# Patient Record
Sex: Female | Born: 1970 | Race: White | Hispanic: No | Marital: Married | Smoking: Former smoker
Health system: Southern US, Community
[De-identification: ages and names within clinical notes are randomized; demographics above are authoritative.]

## PROBLEM LIST (undated history)

## (undated) DIAGNOSIS — K219 Gastro-esophageal reflux disease without esophagitis: Secondary | ICD-10-CM

## (undated) DIAGNOSIS — E785 Hyperlipidemia, unspecified: Secondary | ICD-10-CM

## (undated) DIAGNOSIS — F32A Depression, unspecified: Secondary | ICD-10-CM

## (undated) DIAGNOSIS — F329 Major depressive disorder, single episode, unspecified: Secondary | ICD-10-CM

## (undated) DIAGNOSIS — M1990 Unspecified osteoarthritis, unspecified site: Secondary | ICD-10-CM

## (undated) DIAGNOSIS — F419 Anxiety disorder, unspecified: Secondary | ICD-10-CM

## (undated) DIAGNOSIS — I10 Essential (primary) hypertension: Secondary | ICD-10-CM

## (undated) HISTORY — PX: ENDOMETRIAL ABLATION W/ NOVASURE: SUR434

## (undated) HISTORY — PX: SHOULDER ARTHROSCOPY: SHX128

## (undated) HISTORY — PX: TUBAL LIGATION: SHX77

## (undated) HISTORY — PX: CHOLECYSTECTOMY: SHX55

## (undated) HISTORY — DX: Gastro-esophageal reflux disease without esophagitis: K21.9

## (undated) HISTORY — PX: SHOULDER OPEN ROTATOR CUFF REPAIR: SHX2407

---

## 2017-10-18 ENCOUNTER — Encounter: Payer: Self-pay | Admitting: Gastroenterology

## 2018-01-18 ENCOUNTER — Ambulatory Visit: Payer: Self-pay | Admitting: Nurse Practitioner

## 2018-01-18 NOTE — Progress Notes (Deleted)
Primary Care Physician:  Avon GullyFanta, Tesfaye, MD Primary Gastroenterologist:  Dr.   Bonnetta BarryNo chief complaint on file.   HPI:   Teresa Evans is a 47 y.o. female who presents on referral from primary care for rectal bleeding.  Reviewed information provided with the referral including ***.  No history of colonoscopy or endoscopy in our system.  Today she states   No past medical history on file.  *** The histories are not reviewed yet. Please review them in the "History" navigator section and refresh this SmartLink.  No current outpatient medications on file.   No current facility-administered medications for this visit.     Allergies as of 01/18/2018  . (Not on File)    No family history on file.  Social History   Socioeconomic History  . Marital status: Not on file    Spouse name: Not on file  . Number of children: Not on file  . Years of education: Not on file  . Highest education level: Not on file  Occupational History  . Not on file  Social Needs  . Financial resource strain: Not on file  . Food insecurity:    Worry: Not on file    Inability: Not on file  . Transportation needs:    Medical: Not on file    Non-medical: Not on file  Tobacco Use  . Smoking status: Not on file  Substance and Sexual Activity  . Alcohol use: Not on file  . Drug use: Not on file  . Sexual activity: Not on file  Lifestyle  . Physical activity:    Days per week: Not on file    Minutes per session: Not on file  . Stress: Not on file  Relationships  . Social connections:    Talks on phone: Not on file    Gets together: Not on file    Attends religious service: Not on file    Active member of club or organization: Not on file    Attends meetings of clubs or organizations: Not on file    Relationship status: Not on file  . Intimate partner violence:    Fear of current or ex partner: Not on file    Emotionally abused: Not on file    Physically abused: Not on file    Forced sexual  activity: Not on file  Other Topics Concern  . Not on file  Social History Narrative  . Not on file    Review of Systems: General: Negative for anorexia, weight loss, fever, chills, fatigue, weakness. Eyes: Negative for vision changes.  ENT: Negative for hoarseness, difficulty swallowing , nasal congestion. CV: Negative for chest pain, angina, palpitations, dyspnea on exertion, peripheral edema.  Respiratory: Negative for dyspnea at rest, dyspnea on exertion, cough, sputum, wheezing.  GI: See history of present illness. GU:  Negative for dysuria, hematuria, urinary incontinence, urinary frequency, nocturnal urination.  MS: Negative for joint pain, low back pain.  Derm: Negative for rash or itching.  Neuro: Negative for weakness, abnormal sensation, seizure, frequent headaches, memory loss, confusion.  Psych: Negative for anxiety, depression, suicidal ideation, hallucinations.  Endo: Negative for unusual weight change.  Heme: Negative for bruising or bleeding. Allergy: Negative for rash or hives.    Physical Exam: There were no vitals taken for this visit. General:   Alert and oriented. Pleasant and cooperative. Well-nourished and well-developed.  Head:  Normocephalic and atraumatic. Eyes:  Without icterus, sclera clear and conjunctiva pink.  Ears:  Normal auditory  acuity. Mouth:  No deformity or lesions, oral mucosa pink.  Throat/Neck:  Supple, without mass or thyromegaly. Cardiovascular:  S1, S2 present without murmurs appreciated. Normal pulses noted. Extremities without clubbing or edema. Respiratory:  Clear to auscultation bilaterally. No wheezes, rales, or rhonchi. No distress.  Gastrointestinal:  +BS, soft, non-tender and non-distended. No HSM noted. No guarding or rebound. No masses appreciated.  Rectal:  Deferred  Musculoskalatal:  Symmetrical without gross deformities. Normal posture. Skin:  Intact without significant lesions or rashes. Neurologic:  Alert and oriented  x4;  grossly normal neurologically. Psych:  Alert and cooperative. Normal mood and affect. Heme/Lymph/Immune: No significant cervical adenopathy. No excessive bruising noted.    01/18/2018 8:11 AM   Disclaimer: This note was dictated with voice recognition software. Similar sounding words can inadvertently be transcribed and may not be corrected upon review.

## 2018-03-29 ENCOUNTER — Other Ambulatory Visit: Payer: Self-pay | Admitting: Orthopedic Surgery

## 2018-03-30 ENCOUNTER — Encounter (HOSPITAL_BASED_OUTPATIENT_CLINIC_OR_DEPARTMENT_OTHER): Payer: Self-pay | Admitting: *Deleted

## 2018-03-30 ENCOUNTER — Other Ambulatory Visit: Payer: Self-pay

## 2018-03-30 NOTE — H&P (Signed)
Teresa Evans is an 48 y.o. female.   CC / Reason for Visit: Left wrist injury HPI: This patient returns reevaluation, indicating that she is having some altered sensibility in the hand, affecting at times all the digits.  She continues medications for pain management, and her cast has slid somewhat distally, to some degree blocking MP motion.  HPI 03-21-18: This patient returns reevaluation, in the sugar tong splint which has migrated somewhat distally.  She continues to use oxycodone every 6 hours, in addition to the other medicines.  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  She reports that she was coming down off of a ladder at work, missing the last step and falling onto an outstretched left hand.  She was evaluated at urgent care, where x-rays were obtained and she was placed into a prefabricated short arm splint and provided a prescription for Relafen.  She reports that her pain has been difficult to manage.  She was not at work today.  Past Medical History:  Diagnosis Date  . Arthritis   . Depression   . Hyperlipemia   . Hypertension     Past Surgical History:  Procedure Laterality Date  . CESAREAN SECTION     x3  . ENDOMETRIAL ABLATION W/ NOVASURE    . SHOULDER ARTHROSCOPY    . SHOULDER OPEN ROTATOR CUFF REPAIR    . TUBAL LIGATION      History reviewed. No pertinent family history. Social History:  reports that she has never smoked. She has never used smokeless tobacco. She reports that she does not drink alcohol or use drugs.  Allergies: No Known Allergies  No medications prior to admission.    No results found for this or any previous visit (from the past 48 hour(s)). No results found.  Review of Systems  All other systems reviewed and are negative.   Height 5\' 1"  (1.549 m), weight 90.7 kg. Physical Exam  Constitutional:  WD, WN, NAD HEENT:  NCAT, EOMI Neuro/Psych:  Alert &  oriented to person, place, and time; appropriate mood & affect Lymphatic: No generalized UE edema or lymphadenopathy Extremities / MSK:  Both UE are normal with respect to appearance, ranges of motion, joint stability, muscle strength/tone, sensation, & perfusion except as otherwise noted:  [facility] has slid distally, blocking MP motion.  Mildly affecting all the digits, with a mild Tinel sign over the ulnar nerve at the elbow.  Labs / Xrays:  4 views of the left wrist ordered and obtained today in the splint reveal an intra-articular fracture of the distal radius without significant intra-articular incongruity.  Radial inclination is just 5, and dorsal tilt has increased 18.  Assessment: Slightly impacted left distal radius fracture with intra-articular component, without significant intra-articular incongruity, with progressive change in alignment parameters despite immobilization, and new onset neurological symptoms, including median neuropathic symptoms  Plan:  I discussed these findings with her and her husband.  I recommended open treatment of the distal radius fracture combined with an open carpal tunnel release given the new onset of neurological symptoms in the change in the fracture parameters.  A model was used to demonstrate the issues that arise with parameters as they exist.  We will proceed either on Friday or Monday, pending Worker's Compensation authorization.  The cast was bivalved and overwrapped today to expedite care on the day of surgery.  Oxycodone was refilled electronically.  We discussed  being out of work following surgery for roughly a week before returning back to light duty work as she is presently performing.  The details of the operative procedure were discussed with the patient.  Questions were invited and answered.  In addition to the goal of the procedure, the risks of the procedure to include but not limited to bleeding; infection; damage to the nerves or blood vessels  that could result in bleeding, numbness, weakness, chronic pain, and the need for additional procedures; stiffness; the need for revision surgery; and anesthetic risks were reviewed.  No specific outcome was guaranteed or implied.  Informed consent was obtained.  Jodi Marble, MD 03/30/2018, 5:08 PM

## 2018-04-03 ENCOUNTER — Ambulatory Visit (HOSPITAL_BASED_OUTPATIENT_CLINIC_OR_DEPARTMENT_OTHER): Payer: PRIVATE HEALTH INSURANCE | Admitting: Anesthesiology

## 2018-04-03 ENCOUNTER — Encounter (HOSPITAL_BASED_OUTPATIENT_CLINIC_OR_DEPARTMENT_OTHER)
Admission: RE | Disposition: A | Discharge status: Home / Self Care | Payer: Self-pay | Source: Home / Self Care | Attending: Orthopedic Surgery

## 2018-04-03 ENCOUNTER — Ambulatory Visit (HOSPITAL_BASED_OUTPATIENT_CLINIC_OR_DEPARTMENT_OTHER)
Admission: RE | Admit: 2018-04-03 | Discharge: 2018-04-03 | Disposition: A | Discharge status: Home / Self Care | Payer: PRIVATE HEALTH INSURANCE | Attending: Orthopedic Surgery | Admitting: Orthopedic Surgery

## 2018-04-03 ENCOUNTER — Ambulatory Visit (HOSPITAL_COMMUNITY): Payer: PRIVATE HEALTH INSURANCE

## 2018-04-03 ENCOUNTER — Other Ambulatory Visit: Payer: Self-pay

## 2018-04-03 ENCOUNTER — Encounter (HOSPITAL_BASED_OUTPATIENT_CLINIC_OR_DEPARTMENT_OTHER): Payer: Self-pay | Admitting: *Deleted

## 2018-04-03 DIAGNOSIS — S52572A Other intraarticular fracture of lower end of left radius, initial encounter for closed fracture: Secondary | ICD-10-CM | POA: Insufficient documentation

## 2018-04-03 DIAGNOSIS — F329 Major depressive disorder, single episode, unspecified: Secondary | ICD-10-CM | POA: Diagnosis not present

## 2018-04-03 DIAGNOSIS — Z79899 Other long term (current) drug therapy: Secondary | ICD-10-CM | POA: Insufficient documentation

## 2018-04-03 DIAGNOSIS — G5602 Carpal tunnel syndrome, left upper limb: Secondary | ICD-10-CM | POA: Insufficient documentation

## 2018-04-03 DIAGNOSIS — W11XXXA Fall on and from ladder, initial encounter: Secondary | ICD-10-CM | POA: Insufficient documentation

## 2018-04-03 DIAGNOSIS — M1990 Unspecified osteoarthritis, unspecified site: Secondary | ICD-10-CM | POA: Diagnosis not present

## 2018-04-03 DIAGNOSIS — Z419 Encounter for procedure for purposes other than remedying health state, unspecified: Secondary | ICD-10-CM

## 2018-04-03 DIAGNOSIS — I10 Essential (primary) hypertension: Secondary | ICD-10-CM | POA: Diagnosis not present

## 2018-04-03 DIAGNOSIS — E785 Hyperlipidemia, unspecified: Secondary | ICD-10-CM | POA: Insufficient documentation

## 2018-04-03 DIAGNOSIS — Y990 Civilian activity done for income or pay: Secondary | ICD-10-CM | POA: Diagnosis not present

## 2018-04-03 HISTORY — DX: Depression, unspecified: F32.A

## 2018-04-03 HISTORY — DX: Hyperlipidemia, unspecified: E78.5

## 2018-04-03 HISTORY — PX: CARPAL TUNNEL RELEASE: SHX101

## 2018-04-03 HISTORY — PX: OPEN REDUCTION INTERNAL FIXATION (ORIF) DISTAL RADIAL FRACTURE: SHX5989

## 2018-04-03 HISTORY — DX: Major depressive disorder, single episode, unspecified: F32.9

## 2018-04-03 HISTORY — DX: Essential (primary) hypertension: I10

## 2018-04-03 HISTORY — DX: Unspecified osteoarthritis, unspecified site: M19.90

## 2018-04-03 SURGERY — OPEN REDUCTION INTERNAL FIXATION (ORIF) DISTAL RADIUS FRACTURE
Anesthesia: General | Site: Wrist | Laterality: Left

## 2018-04-03 MED ORDER — LACTATED RINGERS IV SOLN: INTRAVENOUS | Status: DC

## 2018-04-03 MED ORDER — ONDANSETRON HCL 4 MG/2ML IJ SOLN: 4.0000 mg | Freq: Once | INTRAMUSCULAR | Status: DC | PRN

## 2018-04-03 MED ORDER — FENTANYL CITRATE (PF) 100 MCG/2ML IJ SOLN: 25.0000 ug | INTRAMUSCULAR | Status: DC | PRN

## 2018-04-03 MED ORDER — LACTATED RINGERS IV SOLN
INTRAVENOUS | Status: DC
  Administered 2018-04-03 (×2): via INTRAVENOUS

## 2018-04-03 MED ORDER — FENTANYL CITRATE (PF) 100 MCG/2ML IJ SOLN
50.0000 ug | INTRAMUSCULAR | Status: DC | PRN
  Administered 2018-04-03: 50 ug via INTRAVENOUS

## 2018-04-03 MED ORDER — 0.9 % SODIUM CHLORIDE (POUR BTL) OPTIME
TOPICAL | Status: DC | PRN
  Administered 2018-04-03: 300 mL

## 2018-04-03 MED ORDER — ACETAMINOPHEN 325 MG PO TABS: 650.0000 mg | ORAL_TABLET | Freq: Four times a day (QID) | ORAL | Status: DC

## 2018-04-03 MED ORDER — CEFAZOLIN SODIUM-DEXTROSE 2-4 GM/100ML-% IV SOLN
2.0000 g | INTRAVENOUS | Status: AC
  Administered 2018-04-03: 2 g via INTRAVENOUS

## 2018-04-03 MED ORDER — MIDAZOLAM HCL 2 MG/2ML IJ SOLN
1.0000 mg | INTRAMUSCULAR | Status: DC | PRN
  Administered 2018-04-03: 2 mg via INTRAVENOUS

## 2018-04-03 MED ORDER — BUPIVACAINE-EPINEPHRINE (PF) 0.25% -1:200000 IJ SOLN
INTRAMUSCULAR | Status: AC
  Filled 2018-04-03: qty 30

## 2018-04-03 MED ORDER — CLONIDINE HCL (ANALGESIA) 100 MCG/ML EP SOLN
EPIDURAL | Status: DC | PRN
  Administered 2018-04-03: 50 ug

## 2018-04-03 MED ORDER — OXYCODONE HCL 5 MG PO TABS: 5.0000 mg | ORAL_TABLET | Freq: Four times a day (QID) | ORAL | 0 refills | Status: AC | PRN

## 2018-04-03 MED ORDER — FENTANYL CITRATE (PF) 100 MCG/2ML IJ SOLN
INTRAMUSCULAR | Status: AC
  Filled 2018-04-03: qty 2

## 2018-04-03 MED ORDER — SCOPOLAMINE 1 MG/3DAYS TD PT72: 1.0000 | MEDICATED_PATCH | Freq: Once | TRANSDERMAL | Status: DC | PRN

## 2018-04-03 MED ORDER — PROPOFOL 10 MG/ML IV BOLUS
INTRAVENOUS | Status: DC | PRN
  Administered 2018-04-03: 150 mg via INTRAVENOUS
  Administered 2018-04-03: 50 mg via INTRAVENOUS

## 2018-04-03 MED ORDER — ONDANSETRON HCL 4 MG/2ML IJ SOLN
INTRAMUSCULAR | Status: DC | PRN
  Administered 2018-04-03: 4 mg via INTRAVENOUS

## 2018-04-03 MED ORDER — CEFAZOLIN SODIUM-DEXTROSE 2-4 GM/100ML-% IV SOLN
INTRAVENOUS | Status: AC
  Filled 2018-04-03: qty 100

## 2018-04-03 MED ORDER — BUPIVACAINE-EPINEPHRINE (PF) 0.5% -1:200000 IJ SOLN
INTRAMUSCULAR | Status: DC | PRN
  Administered 2018-04-03: 30 mL via PERINEURAL

## 2018-04-03 MED ORDER — DEXAMETHASONE SODIUM PHOSPHATE 10 MG/ML IJ SOLN
INTRAMUSCULAR | Status: DC | PRN
  Administered 2018-04-03: 10 mg via INTRAVENOUS

## 2018-04-03 MED ORDER — MIDAZOLAM HCL 2 MG/2ML IJ SOLN
INTRAMUSCULAR | Status: AC
  Filled 2018-04-03: qty 2

## 2018-04-03 SURGICAL SUPPLY — 73 items
APPLICATOR COTTON TIP 6 STRL (MISCELLANEOUS) ×1 IMPLANT
APPLICATOR COTTON TIP 6IN STRL (MISCELLANEOUS) ×2
BIT DRILL SOLID 2.0X40MM (BIT) IMPLANT
BIT DRILL SOLID 2.5X40MM (BIT) IMPLANT
BLADE HOOK ENDO STRL (BLADE) ×2 IMPLANT
BLADE MINI RND TIP GREEN BEAV (BLADE) IMPLANT
BLADE SURG 15 STRL LF DISP TIS (BLADE) ×1 IMPLANT
BLADE SURG 15 STRL SS (BLADE) ×2
BLADE TRIANGLE EPF/EGR ENDO (BLADE) ×2 IMPLANT
BNDG COHESIVE 2X5 TAN STRL LF (GAUZE/BANDAGES/DRESSINGS) IMPLANT
BNDG COHESIVE 4X5 TAN STRL (GAUZE/BANDAGES/DRESSINGS) ×2 IMPLANT
BNDG ESMARK 4X9 LF (GAUZE/BANDAGES/DRESSINGS) ×2 IMPLANT
BNDG GAUZE ELAST 4 BULKY (GAUZE/BANDAGES/DRESSINGS) ×2 IMPLANT
BRUSH SCRUB EZ PLAIN DRY (MISCELLANEOUS) ×1 IMPLANT
CANISTER SUCT 1200ML W/VALVE (MISCELLANEOUS) ×2 IMPLANT
CHLORAPREP W/TINT 26ML (MISCELLANEOUS) ×2 IMPLANT
CORD BIPOLAR FORCEPS 12FT (ELECTRODE) ×2 IMPLANT
COVER BACK TABLE 60X90IN (DRAPES) ×2 IMPLANT
COVER MAYO STAND STRL (DRAPES) ×2 IMPLANT
COVER WAND RF STERILE (DRAPES) IMPLANT
CUFF TOURNIQUET SINGLE 18IN (TOURNIQUET CUFF) ×1 IMPLANT
CUFF TOURNIQUET SINGLE 24IN (TOURNIQUET CUFF) IMPLANT
DRAIN TLS ROUND 10FR (DRAIN) IMPLANT
DRAPE C-ARM 42X72 X-RAY (DRAPES) ×2 IMPLANT
DRAPE EXTREMITY T 121X128X90 (DISPOSABLE) ×2 IMPLANT
DRAPE SURG 17X23 STRL (DRAPES) ×2 IMPLANT
DRILL SOLID 2.0X40MM (BIT)
DRILL SOLID 2.5X40MM (BIT)
DRSG ADAPTIC 3X8 NADH LF (GAUZE/BANDAGES/DRESSINGS) ×2 IMPLANT
DRSG EMULSION OIL 3X3 NADH (GAUZE/BANDAGES/DRESSINGS) ×2 IMPLANT
Driver ×2 IMPLANT
ELECT REM PT RETURN 9FT ADLT (ELECTROSURGICAL) ×2
ELECTRODE REM PT RTRN 9FT ADLT (ELECTROSURGICAL) ×1 IMPLANT
GAUZE SPONGE 4X4 12PLY STRL LF (GAUZE/BANDAGES/DRESSINGS) ×2 IMPLANT
GLOVE BIO SURGEON STRL SZ7.5 (GLOVE) ×2 IMPLANT
GLOVE BIOGEL PI IND STRL 7.0 (GLOVE) ×1 IMPLANT
GLOVE BIOGEL PI IND STRL 8 (GLOVE) ×1 IMPLANT
GLOVE BIOGEL PI INDICATOR 7.0 (GLOVE) ×1
GLOVE BIOGEL PI INDICATOR 8 (GLOVE) ×1
GLOVE ECLIPSE 6.5 STRL STRAW (GLOVE) ×2 IMPLANT
GOWN STRL REUS W/ TWL LRG LVL3 (GOWN DISPOSABLE) ×2 IMPLANT
GOWN STRL REUS W/TWL LRG LVL3 (GOWN DISPOSABLE) ×2
GOWN STRL REUS W/TWL XL LVL3 (GOWN DISPOSABLE) ×2 IMPLANT
GUIDE AIMING 1.5MM (WIRE) ×1 IMPLANT
NDL HYPO 25X1 1.5 SAFETY (NEEDLE) IMPLANT
NEEDLE HYPO 25X1 1.5 SAFETY (NEEDLE) IMPLANT
NS IRRIG 1000ML POUR BTL (IV SOLUTION) ×2 IMPLANT
PACK BASIN DAY SURGERY FS (CUSTOM PROCEDURE TRAY) ×2 IMPLANT
PADDING CAST ABS 4INX4YD NS (CAST SUPPLIES) ×1
PADDING CAST ABS COTTON 4X4 ST (CAST SUPPLIES) IMPLANT
PENCIL BUTTON HOLSTER BLD 10FT (ELECTRODE) ×2 IMPLANT
RUBBERBAND STERILE (MISCELLANEOUS) IMPLANT
SCREW GEMINUS PALS 2.5X12 (Screw) ×1 IMPLANT
SKELETAL DYNAMICS DVR SET (Set) ×1 IMPLANT
SLEEVE SCD COMPRESS KNEE MED (MISCELLANEOUS) ×2 IMPLANT
SLING ARM FOAM STRAP LRG (SOFTGOODS) ×1 IMPLANT
SPLINT PLASTER CAST XFAST 3X15 (CAST SUPPLIES) IMPLANT
SPLINT PLASTER XTRA FASTSET 3X (CAST SUPPLIES) ×7
STOCKINETTE 6  STRL (DRAPES) ×1
STOCKINETTE 6 STRL (DRAPES) ×1 IMPLANT
SUCTION FRAZIER HANDLE 10FR (MISCELLANEOUS) ×1
SUCTION TUBE FRAZIER 10FR DISP (MISCELLANEOUS) ×1 IMPLANT
SUT VIC AB 2-0 PS2 27 (SUTURE) ×2 IMPLANT
SUT VICRYL 4-0 PS2 18IN ABS (SUTURE) IMPLANT
SUT VICRYL RAPIDE 4-0 (SUTURE) ×1 IMPLANT
SUT VICRYL RAPIDE 4/0 PS 2 (SUTURE) ×2 IMPLANT
SYR 10ML LL (SYRINGE) IMPLANT
SYR BULB 3OZ (MISCELLANEOUS) ×2 IMPLANT
TOWEL GREEN STERILE FF (TOWEL DISPOSABLE) ×4 IMPLANT
TUBE CONNECTING 20X1/4 (TUBING) ×2 IMPLANT
UNDERPAD 30X30 (UNDERPADS AND DIAPERS) ×2 IMPLANT
WIRE FIX 1.5 STANDARD TIP (WIRE)
WIRE FIX 1.5 STD TIP (WIRE) IMPLANT

## 2018-04-03 NOTE — Discharge Instructions (Addendum)
Discharge Instructions   You have a dressing with a plaster splint incorporated in it. Move your fingers as much as possible, making a full fist and fully opening the fist. Elevate your hand to reduce pain & swelling of the digits.  Ice over the operative site or in the arm pit may be helpful to reduce pain & swelling.  DO NOT USE HEAT. Pain medicine has been prescribed for you.  Take Tylenol 650 mg and Ibuprofen 600 mg every 6 hours together. Take Oxycodone 5 mg as a rescue medicine. Take [phone] mg of vitamin C daily to help with healing. Leave the dressing in place until you return to our office.  You may shower, but keep the bandage clean & dry.  You may drive a car when you are off of prescription pain medications and can safely control your vehicle with both hands. Our office will call you to arrange follow-up   Please call [phone] during normal business hours or [phone] after hours for any problems. Including the following:  - excessive redness of the incisions - drainage for more than 4 days - fever of more than 101.5 F  *Please note that pain medications will not be refilled after hours or on weekends.  WORK STATUS:   No work from today thru March 1st. May return to work on Monday, March 2nd, with no work with left hand for 2 weeks.    Post Anesthesia Home Care Instructions  Activity: Get plenty of rest for the remainder of the day. A responsible individual must stay with you for 24 hours following the procedure.  For the next 24 hours, DO NOT: -Drive a car -Advertising copywriter -Drink alcoholic beverages -Take any medication unless instructed by your physician -Make any legal decisions or sign important papers.  Meals: Start with liquid foods such as gelatin or soup. Progress to regular foods as tolerated. Avoid greasy, spicy, heavy foods. If nausea and/or vomiting occur, drink only clear liquids until the nausea and/or vomiting subsides. Call your physician  if vomiting continues.  Special Instructions/Symptoms: Your throat may feel dry or sore from the anesthesia or the breathing tube placed in your throat during surgery. If this causes discomfort, gargle with warm salt water. The discomfort should disappear within 24 hours.  If you had a scopolamine patch placed behind your ear for the management of post- operative nausea and/or vomiting:  1. The medication in the patch is effective for 72 hours, after which it should be removed.  Wrap patch in a tissue and discard in the trash. Wash hands thoroughly with soap and water. 2. You may remove the patch earlier than 72 hours if you experience unpleasant side effects which may include dry mouth, dizziness or visual disturbances. 3. Avoid touching the patch. Wash your hands with soap and water after contact with the patch.     Regional Anesthesia Blocks  1. Numbness or the inability to move the "blocked" extremity may last from 3-48 hours after placement. The length of time depends on the medication injected and your individual response to the medication. If the numbness is not going away after 48 hours, call your surgeon.  2. The extremity that is blocked will need to be protected until the numbness is gone and the  Strength has returned. Because you cannot feel it, you will need to take extra care to avoid injury. Because it may be weak, you may have difficulty moving it or using it. You may not know what position  it is in without looking at it while the block is in effect.  3. For blocks in the legs and feet, returning to weight bearing and walking needs to be done carefully. You will need to wait until the numbness is entirely gone and the strength has returned. You should be able to move your leg and foot normally before you try and bear weight or walk. You will need someone to be with you when you first try to ensure you do not fall and possibly risk injury.  4. Bruising and tenderness at the needle  site are common side effects and will resolve in a few days.  5. Persistent numbness or new problems with movement should be communicated to the surgeon or the [facility] [phone] [facility] [phone]).

## 2018-04-03 NOTE — Transfer of Care (Signed)
Immediate Anesthesia Transfer of Care Note  Patient: Teresa Evans  Procedure(s) Performed: OPEN TREATMENT OF LEFT DISTAL RADIAL FRACTURE (Left Wrist) OPEN LEFT CARPAL TUNNEL RELEASE (Left Wrist)  Patient Location: PACU  Anesthesia Type:GA combined with regional for post-op pain  Level of Consciousness: sedated  Airway & Oxygen Therapy: Patient Spontanous Breathing and Patient connected to face mask oxygen  Post-op Assessment: Report given to RN and Post -op Vital signs reviewed and stable  Post vital signs: Reviewed and stable  Last Vitals:  Vitals Value Taken Time  BP 100/62 04/03/2018 11:56 AM  Temp    Pulse 69 04/03/2018 11:58 AM  Resp 18 04/03/2018 11:58 AM  SpO2 94 % 04/03/2018 11:58 AM  Vitals shown include unvalidated device data.  Last Pain:  Vitals:   04/03/18 0816  TempSrc: Oral  PainSc: 3          Complications: No apparent anesthesia complications

## 2018-04-03 NOTE — Anesthesia Procedure Notes (Signed)
Anesthesia Regional Block: Supraclavicular block   Pre-Anesthetic Checklist: ,, timeout performed, Correct Patient, Correct Site, Correct Laterality, Correct Procedure, Correct Position, site marked, Risks and benefits discussed,  Surgical consent,  Pre-op evaluation,  At surgeon's request and post-op pain management  Laterality: Left  Prep: chloraprep       Needles:  Injection technique: Single-shot  Needle Type: Echogenic Stimulator Needle     Needle Length: 9cm  Needle Gauge: 21     Additional Needles:   Procedures:, nerve stimulator,,, ultrasound used (permanent image in chart),,,,   Nerve Stimulator or Paresthesia:  Response: deltoid, biceps, wrist flexion, 0.5 mA,   Additional Responses:   Narrative:  Start time: 04/03/2018 9:33 AM End time: 04/03/2018 9:40 AM Injection made incrementally with aspirations every 5 mL.  Performed by: Personally  Anesthesiologist: Marcene Duos, MD

## 2018-04-03 NOTE — Anesthesia Preprocedure Evaluation (Signed)
Anesthesia Evaluation  Patient identified by MRN, date of birth, ID band Patient awake    Reviewed: Allergy & Precautions, NPO status , Patient's Chart, lab work & pertinent test results  Airway Mallampati: II  TM Distance: >3 FB Neck ROM: Full    Dental   Pulmonary neg pulmonary ROS,    breath sounds clear to auscultation       Cardiovascular hypertension, Pt. on medications negative cardio ROS   Rhythm:Regular Rate:Normal     Neuro/Psych negative neurological ROS  negative psych ROS   GI/Hepatic negative GI ROS, Neg liver ROS,   Endo/Other  negative endocrine ROS  Renal/GU negative Renal ROS  negative genitourinary   Musculoskeletal  (+) Arthritis ,   Abdominal   Peds negative pediatric ROS (+)  Hematology negative hematology ROS (+)   Anesthesia Other Findings   Reproductive/Obstetrics negative OB ROS                            No results found for: WBC, HGB, HCT, MCV, PLT  Anesthesia Physical Anesthesia Plan  ASA: II  Anesthesia Plan: General   Post-op Pain Management:  Regional for Post-op pain   Induction: Intravenous  PONV Risk Score and Plan: 3 and Dexamethasone, Ondansetron and Treatment may vary due to age or medical condition  Airway Management Planned: LMA  Additional Equipment:   Intra-op Plan:   Post-operative Plan: Extubation in OR  Informed Consent: I have reviewed the patients History and Physical, chart, labs and discussed the procedure including the risks, benefits and alternatives for the proposed anesthesia with the patient or authorized representative who has indicated his/her understanding and acceptance.     Dental advisory given  Plan Discussed with: CRNA  Anesthesia Plan Comments:         Anesthesia Quick Evaluation

## 2018-04-03 NOTE — Progress Notes (Signed)
Assisted Dr. Rob Fitzgerald with left, ultrasound guided, supraclavicular block. Side rails up, monitors on throughout procedure. See vital signs in flow sheet. Tolerated Procedure well. 

## 2018-04-03 NOTE — Interval H&P Note (Signed)
History and Physical Interval Note:  04/03/2018 10:19 AM  Teresa Evans  has presented today for surgery, with the diagnosis of Displaced left distal radius fracture and carpal tunnel syndrome  The various methods of treatment have been discussed with the patient and family. After consideration of risks, benefits and other options for treatment, the patient has consented to  Procedure(s): OPEN TREATMENT OF LEFT DISTAL RADIAL FRACTURE (Left) OPEN LEFT CARPAL TUNNEL RELEASE (Left) as a surgical intervention .  The patient's history has been reviewed, patient examined, no change in status, stable for surgery.  I have reviewed the patient's chart and labs.  Questions were answered to the patient's satisfaction.     Jodi Marble

## 2018-04-03 NOTE — Op Note (Signed)
04/03/2018  12:04 PM  PATIENT:  Teresa Evans  48 y.o. female  PRE-OPERATIVE DIAGNOSIS:   1. Displaced left distal radius fracture      2. Left carpal tunnel syndrome  POST-OPERATIVE DIAGNOSIS:  Same  PROCEDURE:   1.  Open treatment of left distal radius fracture, intra-articular, with 2 fragments, 17915    2.  Open left carpal tunnel release     SURGEON: Cliffton Asters. Janee Morn, MD  PHYSICIAN ASSISTANT: Danielle Rankin, OPA-C  ANESTHESIA:  regional and general  SPECIMENS:  None  DRAINS: None  EBL:  less than 50 mL  PREOPERATIVE INDICATIONS:  Teresa Evans is a  48 y.o. female with displaced intra-articular (DRUJ) left distal radius fracture and carpal tunnel syndrome, with significant progressive displacement of the distal radius fracture  The risks benefits and alternatives were discussed with the patient preoperatively including but not limited to the risks of infection, bleeding, nerve injury, cardiopulmonary complications, the need for revision surgery, among others, and the patient verbalized understanding and consented to proceed.  OPERATIVE IMPLANTS: Skeletal dynamics Geminus plate/screws/pegs  OPERATIVE PROCEDURE: After receiving prophylactic antibiotics and a regional block, the patient was escorted to the operative theatre and placed in a supine position.  General anesthesia was administered.  A surgical "time-out" was performed during which the planned procedure, proposed operative site, and the correct patient identity were compared to the operative consent and agreement confirmed by the circulating nurse according to current facility policy. Following application of a tourniquet to the operative extremity, the exposed skin was pre-scrubbed with Hibiclens scrub brush and then was prepped with Chloraprep and draped in the usual sterile fashion. The limb was exsanguinated with an Esmarch bandage and the tourniquet inflated to approximately higher than systolic BP.   The  carpal tunnel release was performed first, by making a linear longitudinal incision in the base of the palm in line with the radial border of the ring ray.  The skin was incised sharply with a scalpel, subcutaneous tissues with spreading dissection down to the palmar fascia.  The distal edge of the transverse carpal ligament was identified and then the transverse carpal ligament and palmar fascia was split with a knife, protecting the contents of the tunnel with an Adson forcep.  Once the transverse carpal ligament was completely incised, opening the tunnel, the distal forearm fascia was split for an inch or 2 proximally.    Next, a sinusoidal-shaped incision was marked and made over the FCR axis and the distal forearm.  Ultimately, this was made continuous with the distal incision.  The skin was incised sharply with scalpel, subcutaneous tissues with blunt and spreading dissection. The FCR axis was exploited deeply. The pronator quadratus was reflected in an L-shaped ulnarly and the brachioradialis was split in a Z-plasty fashion for later reapproximation. The fracture was inspected and provisionally reduced.  This was confirmed fluoroscopically. The appropriately sized plate was selected and found to fit well. It was placed in its provisional alignment of the radius and this was confirmed fluoroscopically.  It was secured to the radius with a screw through the slotted hole.  Additional adjustments were made as necessary, and the distal holes were all drilled and filled.  Peg/screw length distally was selected on the shorter side of measurements to minimize the risk for dorsal cortical penetration. The remainder of the proximal holes were drilled and filled.   Final images were obtained and the DRUJ was examined for stability. It was found to be sufficiently stable.  The wound was then copiously irrigated and the brachioradialis repaired with 2-0 Vicryl Rapide suture followed by repair of the pronator quadratus  with the same suture type. Tourniquet was released and additional hemostasis obtained and the skin was closed with 2-0 Vicryl deep dermal buried sutures followed by running 4-0 Vicryl Rapide horizontal mattress suture in the skin. A bulky dressing with a volar plaster component was applied and the patient was taken to the recovery room in stable condition.  DISPOSITION: The patient will be discharged home today with typical post-op instructions, returning in 10-15 days for reevaluation with new x-rays of the affected wrist out of the splint to include an inclined lateral and then transition to therapy to have a custom splint constructed and begin rehabilitation.

## 2018-04-03 NOTE — Anesthesia Postprocedure Evaluation (Signed)
Anesthesia Post Note  Patient: Teresa Evans  Procedure(s) Performed: OPEN TREATMENT OF LEFT DISTAL RADIAL FRACTURE (Left Wrist) OPEN LEFT CARPAL TUNNEL RELEASE (Left Wrist)     Patient location during evaluation: PACU Anesthesia Type: General Level of consciousness: awake and alert Pain management: pain level controlled Vital Signs Assessment: post-procedure vital signs reviewed and stable Respiratory status: spontaneous breathing, nonlabored ventilation, respiratory function stable and patient connected to nasal cannula oxygen Cardiovascular status: blood pressure returned to baseline and stable Postop Assessment: no apparent nausea or vomiting Anesthetic complications: no    Last Vitals:  Vitals:   04/03/18 1215 04/03/18 1230  BP: 113/80 110/79  Pulse: 79 67  Resp: (!) 31 (!) 24  Temp:    SpO2: 92% 95%    Last Pain:  Vitals:   04/03/18 1230  TempSrc:   PainSc: 0-No pain                 Kennieth Rad

## 2018-04-03 NOTE — Anesthesia Procedure Notes (Signed)
Procedure Name: LMA Insertion Date/Time: 04/03/2018 10:45 AM Performed by: Burna Cash, CRNA Pre-anesthesia Checklist: Patient identified, Emergency Drugs available, Suction available and Patient being monitored Patient Re-evaluated:Patient Re-evaluated prior to induction Oxygen Delivery Method: Circle system utilized Preoxygenation: Pre-oxygenation with 100% oxygen Induction Type: IV induction Ventilation: Mask ventilation without difficulty LMA: LMA inserted LMA Size: 4.0 Number of attempts: 1 Airway Equipment and Method: Bite block Placement Confirmation: positive ETCO2 Tube secured with: Tape Dental Injury: Teeth and Oropharynx as per pre-operative assessment

## 2018-04-04 ENCOUNTER — Encounter (HOSPITAL_BASED_OUTPATIENT_CLINIC_OR_DEPARTMENT_OTHER): Payer: Self-pay | Admitting: Orthopedic Surgery

## 2018-11-30 ENCOUNTER — Other Ambulatory Visit: Payer: Self-pay | Admitting: Internal Medicine

## 2018-11-30 ENCOUNTER — Other Ambulatory Visit: Payer: Self-pay

## 2018-11-30 DIAGNOSIS — Z20822 Contact with and (suspected) exposure to covid-19: Secondary | ICD-10-CM

## 2018-12-02 LAB — NOVEL CORONAVIRUS, NAA: SARS-CoV-2, NAA: NOT DETECTED

## 2018-12-06 LAB — NOVEL CORONAVIRUS, NAA: SARS-CoV-2, NAA: NOT DETECTED

## 2019-09-26 ENCOUNTER — Encounter: Payer: Self-pay | Admitting: *Deleted

## 2019-09-27 ENCOUNTER — Ambulatory Visit: Payer: BC Managed Care – PPO | Admitting: Cardiology

## 2019-09-27 ENCOUNTER — Encounter: Payer: Self-pay | Admitting: Cardiology

## 2019-09-27 VITALS — BP 122/80 | HR 60 | Ht 61.0 in | Wt 204.6 lb

## 2019-09-27 DIAGNOSIS — R011 Cardiac murmur, unspecified: Secondary | ICD-10-CM | POA: Diagnosis not present

## 2019-09-27 DIAGNOSIS — R0789 Other chest pain: Secondary | ICD-10-CM | POA: Diagnosis not present

## 2019-09-27 DIAGNOSIS — R1013 Epigastric pain: Secondary | ICD-10-CM | POA: Diagnosis not present

## 2019-09-27 MED ORDER — PANTOPRAZOLE SODIUM 40 MG PO TBEC: 40.0000 mg | DELAYED_RELEASE_TABLET | Freq: Every day | ORAL | 1 refills | Status: DC

## 2019-09-27 NOTE — Patient Instructions (Signed)
Your physician recommends that you schedule a follow-up appointment in: 3 MONTHS WITH DR [facility]  Your physician has recommended you make the following change in your medication:   STOP OMEPRAZOLE   START PROTONIX 40 MG DAILY   Your physician has requested that you have an echocardiogram. Echocardiography is a painless test that uses sound waves to create images of your heart. It provides your doctor with information about the size and shape of your heart and how well your heart's chambers and valves are working. This procedure takes approximately one hour. There are no restrictions for this procedure.  You have been referred to GASTROENTEROLOGY   Thank you for choosing [facility]!!

## 2019-09-27 NOTE — Progress Notes (Signed)
Clinical Summary Teresa Evans is a 49 y.o.female seen as a new consult, referred by Dr Sherryll Burger for the following medical problems.  1. Chest pain - symptoms started few years, has worsened recently - left sided, sharp pain. Usually occurs with eating greasy foods. No other associated symptoms - pain lasts lasts about 30 minutes. Can be worst with deep breathing - no exertional symptoms - can be tender to palpation - takes omeprazole 80mg  daily x 1 year     CAD risk factors: HL, HTN, former tobacco x 20 years     Past Medical History:  Diagnosis Date  . Arthritis   . Depression   . Hyperlipemia   . Hypertension      No Known Allergies   Current Outpatient Medications  Medication Sig Dispense Refill  . acetaminophen (TYLENOL) 325 MG tablet Take 2 tablets (650 mg total) by mouth every 6 (six) hours.    . citalopram (CELEXA) 20 MG tablet Take 20 mg by mouth daily.    . clonazePAM (KLONOPIN) 1 MG tablet Take 1 mg by mouth 3 (three) times daily as needed for anxiety.    ibuprofen (ADVIL,MOTRIN) 800 MG tablet Take 800 mg by mouth every 8 (eight) hours as needed.    Marland Kitchen lisinopril (PRINIVIL,ZESTRIL) 10 MG tablet Take 10 mg by mouth daily.    . meloxicam (MOBIC) 15 MG tablet Take 15 mg by mouth daily.    Marland Kitchen omeprazole (PRILOSEC) 40 MG capsule Take 40 mg by mouth daily.    Marland Kitchen oxyCODONE (ROXICODONE) 5 MG immediate release tablet Take 1 tablet (5 mg total) by mouth every 6 (six) hours as needed (severe postop pain). 28 tablet 0  . sertraline (ZOLOFT) 100 MG tablet Take 100 mg by mouth daily.    . simvastatin (ZOCOR) 40 MG tablet Take 40 mg by mouth daily.     No current facility-administered medications for this visit.     Past Surgical History:  Procedure Laterality Date  . CARPAL TUNNEL RELEASE Left 04/03/2018   Procedure: OPEN LEFT CARPAL TUNNEL RELEASE;  Surgeon: 04/05/2018, MD;  Location: [facility];  Service: Orthopedics;  Laterality: Left;  . CESAREAN  SECTION     x3  . CHOLECYSTECTOMY    . ENDOMETRIAL ABLATION W/ NOVASURE    . OPEN REDUCTION INTERNAL FIXATION (ORIF) DISTAL RADIAL FRACTURE Left 04/03/2018   Procedure: OPEN TREATMENT OF LEFT DISTAL RADIAL FRACTURE;  Surgeon: 04/05/2018, MD;  Location: [facility];  Service: Orthopedics;  Laterality: Left;  . SHOULDER ARTHROSCOPY    . SHOULDER OPEN ROTATOR CUFF REPAIR    . TUBAL LIGATION       No Known Allergies    Family History  Problem Relation Age of Onset  . COPD Mother   . Cancer Mother   . CVA Father   . Hypertension Brother   . Diabetes Mellitus II Brother      Social History Teresa Evans reports that she has never smoked. She has never used smokeless tobacco. Teresa Evans reports no history of alcohol use.   Review of Systems CONSTITUTIONAL: No weight loss, fever, chills, weakness or fatigue.  HEENT: Eyes: No visual loss, blurred vision, double vision or yellow sclerae.No hearing loss, sneezing, congestion, runny nose or sore throat.  SKIN: No rash or itching.  CARDIOVASCULAR: per hpi RESPIRATORY: No shortness of breath, cough or sputum.  GASTROINTESTINAL: No anorexia, nausea, vomiting or diarrhea. No abdominal pain or blood.  GENITOURINARY: No  burning on urination, no polyuria NEUROLOGICAL: No headache, dizziness, syncope, paralysis, ataxia, numbness or tingling in the extremities. No change in bowel or bladder control.  MUSCULOSKELETAL: No muscle, back pain, joint pain or stiffness.  LYMPHATICS: No enlarged nodes. No history of splenectomy.  PSYCHIATRIC: No history of depression or anxiety.  ENDOCRINOLOGIC: No reports of sweating, cold or heat intolerance. No polyuria or polydipsia.  Marland Kitchen   Physical Examination Today's Vitals   09/27/19 0916  BP: 122/80  Pulse: 60  Weight: 204 lb 9.6 oz (92.8 kg)  Height: 5\' 1"  (1.549 m)   Body mass index is 38.66 kg/m.  Gen: resting comfortably, no acute distress HEENT: no scleral icterus, pupils equal  round and reactive, no palptable cervical adenopathy,  CV: RRR,2/6 systolic murmur rusb, no jvd Resp: Clear to auscultation bilaterally GI: abdomen is soft, non-tender, non-distended, normal bowel sounds, no hepatosplenomegaly MSK: extremities are warm, no edema.  Skin: warm, no rash Neuro:  no focal deficits Psych: appropriate affect     Assessment and Plan  1. Chest pain - noncardiac in description. Brought on with certain foods, no exertional symptoms - d/c omeprazole, change to protonix 40mg  daily - refer to GI - would not plan for ischemic testing at this time.   EKG today shows NSR, no ischemic changes  2. Heart murmur - obtain echo      , M.D.

## 2019-09-28 ENCOUNTER — Encounter: Payer: Self-pay | Admitting: Internal Medicine

## 2019-10-03 ENCOUNTER — Ambulatory Visit (INDEPENDENT_AMBULATORY_CARE_PROVIDER_SITE_OTHER): Payer: BC Managed Care – PPO

## 2019-10-03 DIAGNOSIS — R011 Cardiac murmur, unspecified: Secondary | ICD-10-CM

## 2019-10-03 LAB — ECHOCARDIOGRAM COMPLETE
Area-P 1/2: 3.46 cm2
Calc EF: 67 %
MV M vel: 1.68 m/s
MV Peak grad: 11.3 mmHg
S' Lateral: 2.4 cm
Single Plane A2C EF: 68.5 %
Single Plane A4C EF: 66.8 %

## 2019-10-11 ENCOUNTER — Telehealth: Payer: Self-pay | Admitting: *Deleted

## 2019-10-11 NOTE — Telephone Encounter (Signed)
-----   Message from Antoine Poche, MD sent at 10/10/2019 11:20 AM EDT ----- Normal echo, heart function is normal. No further cardiac testing indicated, follow up as needed   Dominga Ferry MD

## 2019-10-12 NOTE — Telephone Encounter (Signed)
Pt aware.

## 2019-11-04 ENCOUNTER — Ambulatory Visit (INDEPENDENT_AMBULATORY_CARE_PROVIDER_SITE_OTHER): Payer: BC Managed Care – PPO

## 2019-11-04 ENCOUNTER — Ambulatory Visit
Admission: EM | Admit: 2019-11-04 | Discharge: 2019-11-04 | Disposition: A | Discharge status: Home / Self Care | Payer: BC Managed Care – PPO | Attending: Emergency Medicine | Admitting: Emergency Medicine

## 2019-11-04 ENCOUNTER — Encounter: Payer: Self-pay | Admitting: Emergency Medicine

## 2019-11-04 DIAGNOSIS — S52121A Displaced fracture of head of right radius, initial encounter for closed fracture: Secondary | ICD-10-CM | POA: Diagnosis not present

## 2019-11-04 DIAGNOSIS — W19XXXA Unspecified fall, initial encounter: Secondary | ICD-10-CM

## 2019-11-04 DIAGNOSIS — M79631 Pain in right forearm: Secondary | ICD-10-CM | POA: Diagnosis not present

## 2019-11-04 DIAGNOSIS — M79601 Pain in right arm: Secondary | ICD-10-CM

## 2019-11-04 DIAGNOSIS — S59911A Unspecified injury of right forearm, initial encounter: Secondary | ICD-10-CM

## 2019-11-04 HISTORY — DX: Anxiety disorder, unspecified: F41.9

## 2019-11-04 MED ORDER — ACETAMINOPHEN 500 MG PO TABS: 500.0000 mg | ORAL_TABLET | Freq: Four times a day (QID) | ORAL | 0 refills | Status: AC | PRN

## 2019-11-04 MED ORDER — IBUPROFEN 800 MG PO TABS: 800.0000 mg | ORAL_TABLET | Freq: Three times a day (TID) | ORAL | 0 refills | Status: DC

## 2019-11-04 NOTE — ED Provider Notes (Signed)
[facility]   [id number] 11/04/19 Arrival Time: 0806   Chief Complaint  Patient presents with  . Arm Injury    SUBJECTIVE: History from: patient.  Teresa Evans is a 49 y.o. female who presented to the urgent care for complaint of right arm pain that started yesterday.  Reported he tripped and fell and landed on his right arm.  He localizes the pain to the the right arm.  He describes the pain as constant and achy.  He has tried OTC medications without relief.  His symptoms are made worse with ROM.  He denies similar symptoms in the past.  Denies chills, fever, nausea, vomiting, diarrhea   ROS: As per HPI.  All other pertinent ROS negative.     Past Medical History:  Diagnosis Date  . Anxiety   . Arthritis   . Depression   . Hyperlipemia   . Hypertension    Past Surgical History:  Procedure Laterality Date  . CARPAL TUNNEL RELEASE Left 04/03/2018   Procedure: OPEN LEFT CARPAL TUNNEL RELEASE;  Surgeon: Mack Hook, MD;  Location: [facility];  Service: Orthopedics;  Laterality: Left;  . CESAREAN SECTION     x3  . CHOLECYSTECTOMY    . ENDOMETRIAL ABLATION W/ NOVASURE    . OPEN REDUCTION INTERNAL FIXATION (ORIF) DISTAL RADIAL FRACTURE Left 04/03/2018   Procedure: OPEN TREATMENT OF LEFT DISTAL RADIAL FRACTURE;  Surgeon: Mack Hook, MD;  Location: [facility];  Service: Orthopedics;  Laterality: Left;  . SHOULDER ARTHROSCOPY    . SHOULDER OPEN ROTATOR CUFF REPAIR    . TUBAL LIGATION     No Known Allergies No current facility-administered medications on file prior to encounter.   Current Outpatient Medications on File Prior to Encounter  Medication Sig Dispense Refill  . clonazePAM (KLONOPIN) 1 MG tablet Take 1 mg by mouth 3 (three) times daily as needed for anxiety.    Marland Kitchen lisinopril (PRINIVIL,ZESTRIL) 10 MG tablet Take 10 mg by mouth daily.    Marland Kitchen oxyCODONE (ROXICODONE) 5 MG immediate release tablet Take 1 tablet (5 mg total) by  mouth every 6 (six) hours as needed (severe postop pain). 28 tablet 0  . pantoprazole (PROTONIX) 40 MG tablet Take 1 tablet (40 mg total) by mouth daily. 90 tablet 1  . sertraline (ZOLOFT) 100 MG tablet Take 100 mg by mouth daily.    . simvastatin (ZOCOR) 40 MG tablet Take 40 mg by mouth daily.     Social History   Socioeconomic History  . Marital status: Married    Spouse name: Not on file  . Number of children: Not on file  . Years of education: Not on file  . Highest education level: Not on file  Occupational History  . Not on file  Tobacco Use  . Smoking status: Former Smoker    Types: Cigarettes    Quit date: 02/09/2008    Years since quitting: 11.7  . Smokeless tobacco: Never Used  Vaping Use  . Vaping Use: Never used  Substance and Sexual Activity  . Alcohol use: Never  . Drug use: Never  . Sexual activity: Not on file  Other Topics Concern  . Not on file  Social History Narrative  . Not on file   Social Determinants of Health   Financial Resource Strain:   . Difficulty of Paying Living Expenses: Not on file  Food Insecurity:   . Worried About Programme researcher, broadcasting/film/video in the Last Year: Not on file  .  Ran Out of Food in the Last Year: Not on file  Transportation Needs:   . Lack of Transportation (Medical): Not on file  . Lack of Transportation (Non-Medical): Not on file  Physical Activity:   . Days of Exercise per Week: Not on file  . Minutes of Exercise per Session: Not on file  Stress:   . Feeling of Stress : Not on file  Social Connections:   . Frequency of Communication with Friends and Family: Not on file  . Frequency of Social Gatherings with Friends and Family: Not on file  . Attends Religious Services: Not on file  . Active Member of Clubs or Organizations: Not on file  . Attends Banker Meetings: Not on file  . Marital Status: Not on file  Intimate Partner Violence:   . Fear of Current or Ex-Partner: Not on file  . Emotionally Abused: Not  on file  . Physically Abused: Not on file  . Sexually Abused: Not on file   Family History  Problem Relation Age of Onset  . COPD Mother   . Cancer Mother   . CVA Father   . Hypertension Brother   . Diabetes Mellitus II Brother     OBJECTIVE:  Vitals:   11/04/19 0829 11/04/19 0830  BP: 137/82   Pulse: 68   Resp: 18   Temp: 98.5 F (36.9 C)   TempSrc: Oral   SpO2: 95%   Weight:  190 lb (86.2 kg)  Height:  5' (1.524 m)    Physical Exam Vitals and nursing note reviewed.  Constitutional:      General: She is not in acute distress.    Appearance: Normal appearance. She is normal weight. She is not ill-appearing, toxic-appearing or diaphoretic.  HENT:     Head: Normocephalic.  Cardiovascular:     Rate and Rhythm: Normal rate and regular rhythm.     Pulses: Normal pulses.     Heart sounds: Normal heart sounds. No murmur heard.  No friction rub. No gallop.   Pulmonary:     Effort: Pulmonary effort is normal. No respiratory distress.     Breath sounds: Normal breath sounds. No stridor. No wheezing, rhonchi or rales.  Chest:     Chest wall: No tenderness.  Musculoskeletal:        General: Tenderness present.     Right forearm: Tenderness present.     Left forearm: Normal.     Comments: The right arm is without any obvious asymmetry or deformity when compared to the left arm.  No ecchymosis, warmth, redness, open wound, lesion.  Limited range of motion due to pain.  Neurovascular status intact.  Neurological:     Mental Status: She is alert and oriented to person, place, and time.    LABS:  No results found for this or any previous visit (from the past 24 hour(s)).   RADIOLOGY:  DG Forearm Right  Result Date: 11/04/2019 CLINICAL DATA:  Fall last night. Right forearm injury and pain. Initial encounter. EXAM: RIGHT FOREARM - 2 VIEW COMPARISON:  None. FINDINGS: Minimally displaced fracture is seen involving the radial head and neck. No other fractures are seen involving  the forearm. IMPRESSION: Minimally displaced fracture of the radial head and neck. Consider dedicated elbow radiographs for further evaluation. Electronically Signed   By: Danae Orleans M.D.   On: 11/04/2019 09:30   Right forearm x-ray is positive for displaced fracture of right radial head.  I have reviewed the x-ray myself  and the radiologist interpretation.  I am in agreement with the radiologist interpretation.  ASSESSMENT & PLAN:  1. Right arm pain   2. Fall, initial encounter   3. Closed displaced fracture of head of right radius, initial encounter     Meds ordered this encounter  Medications  . ibuprofen (ADVIL) 800 MG tablet    Sig: Take 1 tablet (800 mg total) by mouth 3 (three) times daily.    Dispense:  30 tablet    Refill:  0  . acetaminophen (TYLENOL) 500 MG tablet    Sig: Take 1 tablet (500 mg total) by mouth every 6 (six) hours as needed.    Dispense:  30 tablet    Refill:  0    Discharge instructions  Follow RICE instruction as attached Take OTC Tylenol/ibuprofen as needed for pain/may alternate Follow-up with PCP/orthopedic Return or go to ED for worsening of symptoms  Reviewed expectations re: course of current medical issues. Questions answered. Outlined signs and symptoms indicating need for more acute intervention. Patient verbalized understanding. After Visit Summary given.         Durward Parcel, FNP 11/04/19 [phone]

## 2019-11-04 NOTE — ED Triage Notes (Signed)
Pt tripped and fell yesterday and landed on her RT arm. C/o pain from shoulder to forearm.

## 2019-11-04 NOTE — Discharge Instructions (Addendum)
Follow RICE instruction as attached Take OTC Tylenol/ibuprofen as needed for pain/may alternate Follow-up with PCP/orthopedic Return or go to ED for worsening of symptoms

## 2019-11-14 NOTE — Progress Notes (Signed)
Referring Provider: Antoine Poche, MD Primary Care Physician:  Salley Slaughter, NP Primary Gastroenterologist:  Dr. Marletta Lor  Chief Complaint  Patient presents with  . Gastroesophageal Reflux    HPI:   Teresa Evans is a 49 y.o. female presenting today at the request of Branch, Dorothe Pea, MD for epigastric pain.  Office visit with Dr. Wyline Mood 09/27/2019 for chest pain.  Reported history of sharp left-sided chest pain occurring in the setting of greasy foods.  Can last about 30 minutes.  Can be worse with deep breathing.  No exertional symptoms.  She is taking omeprazole 80 mg daily x1 year.  Omeprazole was changed to Protonix 40 mg daily and she was referred to GI.  She did have a heart murmur on exam so echo was pursued.  ECHO 10/03/19 normal. Per Dr. Wyline Mood, no further cardiac testing indicated.   Today: GERD has been present for many years. Taking omeprazole 40 mg BID. Tried pantoprazole from Dr. Wyline Mood and this made her nauseated.   With omeprazole, she will have breakthrough symptoms with eating spicy foods maybe once a week. Otherwise, GERD is well controlled. Symptoms present as a burning sensation in chest. No dysphagia. No pain with swallowing. No abdominal pain. No nausea or vomiting. Takes 800 mg ibuprofen TID since breaking left arm recently.   Limits fried/fatty foods. Drinks soda daily.   No blood in the stools or black stools. Had one episode of rectal bleeding in 2019 in the setting of straining. No rectal bleeding since then. BMs daily. No constipation or diarrhea.   Past Medical History:  Diagnosis Date  . Anxiety   . Arthritis   . Depression   . GERD (gastroesophageal reflux disease)   . Hyperlipemia   . Hypertension     Past Surgical History:  Procedure Laterality Date  . CARPAL TUNNEL RELEASE Left 04/03/2018   Procedure: OPEN LEFT CARPAL TUNNEL RELEASE;  Surgeon: Mack Hook, MD;  Location: [facility];  Service: Orthopedics;  Laterality:  Left;  . CESAREAN SECTION     x3  . CHOLECYSTECTOMY    . ENDOMETRIAL ABLATION W/ NOVASURE    . OPEN REDUCTION INTERNAL FIXATION (ORIF) DISTAL RADIAL FRACTURE Left 04/03/2018   Procedure: OPEN TREATMENT OF LEFT DISTAL RADIAL FRACTURE;  Surgeon: Mack Hook, MD;  Location: [facility];  Service: Orthopedics;  Laterality: Left;  . SHOULDER ARTHROSCOPY    . SHOULDER OPEN ROTATOR CUFF REPAIR    . TUBAL LIGATION      Current Outpatient Medications  Medication Sig Dispense Refill  . acetaminophen (TYLENOL) 500 MG tablet Take 1 tablet (500 mg total) by mouth every 6 (six) hours as needed. 30 tablet 0  . clonazePAM (KLONOPIN) 1 MG tablet Take 1 mg by mouth 3 (three) times daily as needed for anxiety.    Marland Kitchen ibuprofen (ADVIL) 800 MG tablet Take 1 tablet (800 mg total) by mouth 3 (three) times daily. 30 tablet 0  . lisinopril (PRINIVIL,ZESTRIL) 10 MG tablet Take 10 mg by mouth daily.    Marland Kitchen omeprazole (PRILOSEC) 40 MG capsule Take 40 mg by mouth in the morning and at bedtime.    Marland Kitchen oxyCODONE (ROXICODONE) 5 MG immediate release tablet Take 1 tablet (5 mg total) by mouth every 6 (six) hours as needed (severe postop pain). 28 tablet 0  . sertraline (ZOLOFT) 100 MG tablet Take 100 mg by mouth daily.    . simvastatin (ZOCOR) 40 MG tablet Take 40 mg by mouth daily.  No current facility-administered medications for this visit.    Allergies as of 11/15/2019  . (No Known Allergies)    Family History  Problem Relation Age of Onset  . COPD Mother   . Cancer Mother        ?blood or bone cancer  . CVA Father   . Hypertension Brother   . Diabetes Mellitus II Brother   . Colon cancer Neg Hx     Social History   Socioeconomic History  . Marital status: Married    Spouse name: Not on file  . Number of children: Not on file  . Years of education: Not on file  . Highest education level: Not on file  Occupational History  . Not on file  Tobacco Use  . Smoking status: Former Smoker     Types: Cigarettes    Quit date: 02/09/2008    Years since quitting: 11.7  . Smokeless tobacco: Never Used  Vaping Use  . Vaping Use: Never used  Substance and Sexual Activity  . Alcohol use: Never  . Drug use: Never  . Sexual activity: Not on file  Other Topics Concern  . Not on file  Social History Narrative  . Not on file   Social Determinants of Health   Financial Resource Strain:   . Difficulty of Paying Living Expenses: Not on file  Food Insecurity:   . Worried About Programme researcher, broadcasting/film/video in the Last Year: Not on file  . Ran Out of Food in the Last Year: Not on file  Transportation Needs:   . Lack of Transportation (Medical): Not on file  . Lack of Transportation (Non-Medical): Not on file  Physical Activity:   . Days of Exercise per Week: Not on file  . Minutes of Exercise per Session: Not on file  Stress:   . Feeling of Stress : Not on file  Social Connections:   . Frequency of Communication with Friends and Family: Not on file  . Frequency of Social Gatherings with Friends and Family: Not on file  . Attends Religious Services: Not on file  . Active Member of Clubs or Organizations: Not on file  . Attends Banker Meetings: Not on file  . Marital Status: Not on file  Intimate Partner Violence:   . Fear of Current or Ex-Partner: Not on file  . Emotionally Abused: Not on file  . Physically Abused: Not on file  . Sexually Abused: Not on file    Review of Systems: Gen: Denies any fever, chills, cold or flulike symptoms, presyncope, syncope. CV: Denies chest pain or heart palpitations. Resp: Denies shortness of breath or cough. GI: See HPI. GU : Denies urinary burning, urinary frequency, urinary hesitancy Derm: Denies rash Psych: Depression/anxiety well controlled Heme: See HPI  Physical Exam: BP 133/88   Pulse 67   Temp (!) 97.3 F (36.3 C) (Oral)   Ht 5\' 1"  (1.549 m)   Wt 204 lb 6.4 oz (92.7 kg)   BMI 38.62 kg/m  General:   Alert and  oriented. Pleasant and cooperative. Well-nourished and well-developed.  Head:  Normocephalic and atraumatic. Eyes:  Without icterus, sclera clear and conjunctiva pink.  Ears:  Normal auditory acuity. Lungs:  Clear to auscultation bilaterally. No wheezes, rales, or rhonchi. No distress.  Heart:  S1, S2 present without murmurs appreciated.  Abdomen:  +BS, soft, non-tender and non-distended. No HSM noted. No guarding or rebound. No masses appreciated.  Rectal:  Deferred  Msk:  Symmetrical without  gross deformities. Normal posture. Extremities:  Without edema. Neurologic:  Alert and  oriented x4;  grossly normal neurologically. Skin:  Intact without significant lesions or rashes. Psych: Normal mood and affect.

## 2019-11-15 ENCOUNTER — Other Ambulatory Visit: Payer: Self-pay

## 2019-11-15 ENCOUNTER — Encounter: Payer: Self-pay | Admitting: Gastroenterology

## 2019-11-15 ENCOUNTER — Ambulatory Visit (INDEPENDENT_AMBULATORY_CARE_PROVIDER_SITE_OTHER): Payer: BC Managed Care – PPO | Admitting: Gastroenterology

## 2019-11-15 DIAGNOSIS — Z1211 Encounter for screening for malignant neoplasm of colon: Secondary | ICD-10-CM

## 2019-11-15 DIAGNOSIS — K219 Gastro-esophageal reflux disease without esophagitis: Secondary | ICD-10-CM

## 2019-11-15 NOTE — Assessment & Plan Note (Addendum)
Chronic history of GERD currently well controlled on omeprazole 40 mg twice daily.  No alarm symptoms.  No prior EGD.    Plan:  We will plan for EGD for Barrett's esophagus screening. Although less common in females, she does have risk factors including chronic GERD, caucasian, central obesity, and former smoker.  Continue omeprazole 40 mg twice daily. Follow a GERD diet/lifestyle:   Avoid fried, fatty, greasy, spicy, citrus foods.  Avoid caffeine and carbonated beverages.  Avoid chocolate.  Try eating 4-6 small meals a day rather than 3 large meals.  Do not eat within 3 hours of laying down.  Prop head of bed up on wood or bricks to create a 6 inch incline. Follow-up after procedure.

## 2019-11-15 NOTE — Patient Instructions (Signed)
We will get you scheduled for an upper endoscopy and colonoscopy with Dr. Marletta Lor in the near future.   Continue taking omeprazole 40 mg twice daily 30 minutes before breakfast and before dinner.   Follow a GERD diet:  Avoid fried, fatty, greasy, spicy, citrus foods. Avoid caffeine and carbonated beverages. Avoid chocolate. Try eating 4-6 small meals a day rather than 3 large meals. Do not eat within 3 hours of laying down.  We will see you back after your procedures. Please call with questions or concerns prior.   It was great meeting you today!  Ermalinda Memos, PA-C [facility]

## 2019-11-15 NOTE — Assessment & Plan Note (Signed)
49 year old female with turning 49 in December with no prior colonoscopy.  She has no significant lower GI symptoms at this time.  She reports 1 episode of rectal bleeding in 2019 in the setting of straining.  She has not had any return of rectal bleeding since then.  No unintentional weight loss.  No family history of colon cancer.  Plan: Proceed with colonoscopy with propofol with Dr. Marletta Lor in December. The risks, benefits, and alternatives have been discussed with the patient in detail. The patient states understanding and desires to proceed.  ASA II Follow-up after procedure.

## 2019-11-19 ENCOUNTER — Telehealth: Payer: Self-pay | Admitting: *Deleted

## 2019-11-19 NOTE — Telephone Encounter (Signed)
Called pt. TCS/EGD with Dr. Marletta Lor, asa 2 scheduled for 12/17 at 7:30am. Aware will mail covid test appt with instructions. Confirmed mailing address.

## 2020-01-09 ENCOUNTER — Ambulatory Visit: Payer: BC Managed Care – PPO | Admitting: Cardiology

## 2020-01-24 ENCOUNTER — Other Ambulatory Visit (HOSPITAL_COMMUNITY)
Admission: RE | Admit: 2020-01-24 | Discharge: 2020-01-24 | Disposition: A | Discharge status: Home / Self Care | Payer: BC Managed Care – PPO | Source: Ambulatory Visit | Attending: Internal Medicine | Admitting: Internal Medicine

## 2020-01-24 ENCOUNTER — Other Ambulatory Visit: Payer: Self-pay

## 2020-01-24 DIAGNOSIS — Z01812 Encounter for preprocedural laboratory examination: Secondary | ICD-10-CM | POA: Insufficient documentation

## 2020-01-24 DIAGNOSIS — R110 Nausea: Secondary | ICD-10-CM | POA: Diagnosis present

## 2020-01-24 DIAGNOSIS — K2970 Gastritis, unspecified, without bleeding: Secondary | ICD-10-CM | POA: Diagnosis not present

## 2020-01-24 DIAGNOSIS — Z1211 Encounter for screening for malignant neoplasm of colon: Secondary | ICD-10-CM | POA: Diagnosis present

## 2020-01-24 DIAGNOSIS — K648 Other hemorrhoids: Secondary | ICD-10-CM | POA: Diagnosis not present

## 2020-01-24 DIAGNOSIS — K219 Gastro-esophageal reflux disease without esophagitis: Secondary | ICD-10-CM | POA: Diagnosis not present

## 2020-01-24 DIAGNOSIS — Z87891 Personal history of nicotine dependence: Secondary | ICD-10-CM | POA: Diagnosis not present

## 2020-01-24 DIAGNOSIS — Z20822 Contact with and (suspected) exposure to covid-19: Secondary | ICD-10-CM | POA: Insufficient documentation

## 2020-01-24 DIAGNOSIS — K319 Disease of stomach and duodenum, unspecified: Secondary | ICD-10-CM | POA: Diagnosis not present

## 2020-01-24 DIAGNOSIS — Z79899 Other long term (current) drug therapy: Secondary | ICD-10-CM | POA: Diagnosis not present

## 2020-01-24 LAB — SARS CORONAVIRUS 2 (TAT 6-24 HRS): SARS Coronavirus 2: NEGATIVE

## 2020-01-24 NOTE — Progress Notes (Signed)
Called patient had to leave a message with her voicemail. I asked her to call me back so we can schedule her appointment later today since her procedure is tomorrow. Waiting for patient to call. Leaving encounter open. Pt. Called back and ask if she could come in around 1:00, I scheduled her for 1:05. Pt. Called again around 11:25 and asked if she could come on now. Patient is having a colonoscopy tomorrow she's afraid her urges will be bad if she waits. I told her that would be fine. I called registration to make them aware. Nothing further needed.

## 2020-01-25 ENCOUNTER — Ambulatory Visit (HOSPITAL_COMMUNITY)
Admission: RE | Admit: 2020-01-25 | Discharge: 2020-01-25 | Disposition: A | Discharge status: Home / Self Care | Payer: BC Managed Care – PPO | Attending: Internal Medicine | Admitting: Internal Medicine

## 2020-01-25 ENCOUNTER — Encounter (HOSPITAL_COMMUNITY): Payer: Self-pay

## 2020-01-25 ENCOUNTER — Encounter (HOSPITAL_COMMUNITY)
Admission: RE | Disposition: A | Discharge status: Home / Self Care | Payer: Self-pay | Source: Home / Self Care | Attending: Internal Medicine

## 2020-01-25 ENCOUNTER — Other Ambulatory Visit: Payer: Self-pay

## 2020-01-25 ENCOUNTER — Ambulatory Visit (HOSPITAL_COMMUNITY): Payer: BC Managed Care – PPO | Admitting: Certified Registered"

## 2020-01-25 DIAGNOSIS — K2970 Gastritis, unspecified, without bleeding: Secondary | ICD-10-CM | POA: Diagnosis not present

## 2020-01-25 DIAGNOSIS — Z87891 Personal history of nicotine dependence: Secondary | ICD-10-CM | POA: Insufficient documentation

## 2020-01-25 DIAGNOSIS — Z20822 Contact with and (suspected) exposure to covid-19: Secondary | ICD-10-CM | POA: Insufficient documentation

## 2020-01-25 DIAGNOSIS — Z79899 Other long term (current) drug therapy: Secondary | ICD-10-CM | POA: Insufficient documentation

## 2020-01-25 DIAGNOSIS — Z1211 Encounter for screening for malignant neoplasm of colon: Secondary | ICD-10-CM | POA: Diagnosis not present

## 2020-01-25 DIAGNOSIS — K319 Disease of stomach and duodenum, unspecified: Secondary | ICD-10-CM | POA: Insufficient documentation

## 2020-01-25 DIAGNOSIS — K648 Other hemorrhoids: Secondary | ICD-10-CM | POA: Insufficient documentation

## 2020-01-25 DIAGNOSIS — K219 Gastro-esophageal reflux disease without esophagitis: Secondary | ICD-10-CM | POA: Insufficient documentation

## 2020-01-25 HISTORY — PX: BIOPSY: SHX5522

## 2020-01-25 HISTORY — PX: ESOPHAGOGASTRODUODENOSCOPY (EGD) WITH PROPOFOL: SHX5813

## 2020-01-25 HISTORY — PX: COLONOSCOPY WITH PROPOFOL: SHX5780

## 2020-01-25 SURGERY — COLONOSCOPY WITH PROPOFOL
Anesthesia: General

## 2020-01-25 MED ORDER — CHLORHEXIDINE GLUCONATE CLOTH 2 % EX PADS: 6.0000 | MEDICATED_PAD | Freq: Once | CUTANEOUS | Status: DC

## 2020-01-25 MED ORDER — GLYCOPYRROLATE 0.2 MG/ML IJ SOLN
INTRAMUSCULAR | Status: AC
  Filled 2020-01-25: qty 1

## 2020-01-25 MED ORDER — STERILE WATER FOR IRRIGATION IR SOLN
Status: DC | PRN
  Administered 2020-01-25: 08:00:00 100 mL

## 2020-01-25 MED ORDER — LACTATED RINGERS IV SOLN
INTRAVENOUS | Status: DC | PRN
  Administered 2020-01-25: 1000 mL via INTRAVENOUS

## 2020-01-25 MED ORDER — LIDOCAINE HCL (CARDIAC) PF 100 MG/5ML IV SOSY
PREFILLED_SYRINGE | INTRAVENOUS | Status: DC | PRN
  Administered 2020-01-25: 50 mg via INTRAVENOUS

## 2020-01-25 MED ORDER — PROPOFOL 10 MG/ML IV BOLUS
INTRAVENOUS | Status: DC | PRN
  Administered 2020-01-25: 100 mg via INTRAVENOUS
  Administered 2020-01-25: 50 mg via INTRAVENOUS

## 2020-01-25 MED ORDER — LACTATED RINGERS IV SOLN: INTRAVENOUS | Status: DC

## 2020-01-25 MED ORDER — MIDAZOLAM HCL 2 MG/2ML IJ SOLN
INTRAMUSCULAR | Status: DC | PRN
  Administered 2020-01-25: 2 mg via INTRAVENOUS

## 2020-01-25 MED ORDER — LIDOCAINE VISCOUS HCL 2 % MT SOLN
15.0000 mL | Freq: Once | OROMUCOSAL | Status: AC
  Administered 2020-01-25: 08:00:00 15 mL via OROMUCOSAL

## 2020-01-25 MED ORDER — MIDAZOLAM HCL 2 MG/2ML IJ SOLN
INTRAMUSCULAR | Status: AC
  Filled 2020-01-25: qty 2

## 2020-01-25 MED ORDER — PROPOFOL 500 MG/50ML IV EMUL
INTRAVENOUS | Status: DC | PRN
  Administered 2020-01-25: 150 ug/kg/min via INTRAVENOUS

## 2020-01-25 MED ORDER — LIDOCAINE VISCOUS HCL 2 % MT SOLN
OROMUCOSAL | Status: AC
  Filled 2020-01-25: qty 15

## 2020-01-25 MED ORDER — GLYCOPYRROLATE 0.2 MG/ML IJ SOLN
0.2000 mg | Freq: Once | INTRAMUSCULAR | Status: AC
  Administered 2020-01-25: 08:00:00 0.2 mg via INTRAVENOUS

## 2020-01-25 NOTE — Op Note (Signed)
[facility] Patient Name: Teresa Evans Procedure Date: 01/25/2020 7:06 AM MRN: [id number] Date of Birth: 07-May-1970 Attending MD: Elon Alas. Abbey Chatters DO CSN: [id number] Age: 49 Admit Type: Outpatient Procedure:                Upper GI endoscopy Indications:              Heartburn, Nausea Providers:                Elon Alas. Abbey Chatters, DO, Crystal Page, Wynonia Musty Tech, Technician Referring MD:              Medicines:                See the Anesthesia note for documentation of the                            administered medications Complications:            No immediate complications. Estimated Blood Loss:     Estimated blood loss was minimal. Procedure:                Pre-Anesthesia Assessment:                           - The anesthesia plan was to use monitored                            anesthesia care (MAC).                           After obtaining informed consent, the endoscope was                            passed under direct vision. Throughout the                            procedure, the patient's blood pressure, pulse, and                            oxygen saturations were monitored continuously. The                            [id number]) was introduced through the mouth,                            and advanced to the second part of duodenum. The                            upper GI endoscopy was accomplished without                            difficulty. The patient tolerated the procedure                            well. Scope In: 7:44:34 AM Scope Out: 7:47:40  AM Total Procedure Duration: 0 hours 3 minutes 6 seconds  Findings:      There is no endoscopic evidence of esophagitis, hiatal hernia,       inflammation, ulcerations or varices in the entire esophagus.      Diffuse mild inflammation characterized by erythema was found in the       entire examined stomach. Biopsies were taken with a cold forceps for       Helicobacter  pylori testing.      The duodenal bulb, first portion of the duodenum and second portion of       the duodenum were normal. Biopsies for histology were taken with a cold       forceps for evaluation of celiac disease. Impression:               - Gastritis. Biopsied.                           - Normal duodenal bulb, first portion of the                            duodenum and second portion of the duodenum.                            Biopsied. Moderate Sedation:      Per Anesthesia Care Recommendation:           - Patient has a contact number available for                            emergencies. The signs and symptoms of potential                            delayed complications were discussed with the                            patient. Return to normal activities tomorrow.                            Written discharge instructions were provided to the                            patient.                           - Resume previous diet.                           - Continue present medications.                           - Await pathology results.                           - Use a proton pump inhibitor PO BID for 8 weeks.                           - Return to GI clinic in 3 months. Procedure Code(s):        ---  Professional ---                           [phone], Esophagogastroduodenoscopy, flexible,                            transoral; with biopsy, single or multiple Diagnosis Code(s):        --- Professional ---                           K29.70, Gastritis, unspecified, without bleeding                           R12, Heartburn                           R11.0, Nausea CPT copyright 2019 American Medical Association. All rights reserved. The codes documented in this report are preliminary and upon coder review may  be revised to meet current compliance requirements. Elon Alas. Abbey Chatters, DO [place] Abbey Chatters, DO 01/25/2020 7:51:32 AM This report has been signed electronically. Number of  Addenda: 0

## 2020-01-25 NOTE — Discharge Instructions (Signed)
EGD Discharge instructions Please read the instructions outlined below and refer to this sheet in the next few weeks. These discharge instructions provide you with general information on caring for yourself after you leave the hospital. Your doctor may also give you specific instructions. While your treatment has been planned according to the most current medical practices available, unavoidable complications occasionally occur. If you have any problems or questions after discharge, please call your doctor. ACTIVITY  You may resume your regular activity but move at a slower pace for the next 24 hours.   Take frequent rest periods for the next 24 hours.   Walking will help expel (get rid of) the air and reduce the bloated feeling in your abdomen.   No driving for 24 hours (because of the anesthesia (medicine) used during the test).   You may shower.   Do not sign any important legal documents or operate any machinery for 24 hours (because of the anesthesia used during the test).  NUTRITION  Drink plenty of fluids.   You may resume your normal diet.   Begin with a light meal and progress to your normal diet.   Avoid alcoholic beverages for 24 hours or as instructed by your caregiver.  MEDICATIONS  You may resume your normal medications unless your caregiver tells you otherwise.  WHAT YOU CAN EXPECT TODAY  You may experience abdominal discomfort such as a feeling of fullness or "gas" pains.  FOLLOW-UP  Your doctor will discuss the results of your test with you.  SEEK IMMEDIATE MEDICAL ATTENTION IF ANY OF THE FOLLOWING OCCUR:  Excessive nausea (feeling sick to your stomach) and/or vomiting.   Severe abdominal pain and distention (swelling).   Trouble swallowing.   Temperature over 101 F (37.8 C).   Rectal bleeding or vomiting of blood.     Colonoscopy Discharge Instructions  Read the instructions outlined below and refer to this sheet in the next few weeks. These  discharge instructions provide you with general information on caring for yourself after you leave the hospital. Your doctor may also give you specific instructions. While your treatment has been planned according to the most current medical practices available, unavoidable complications occasionally occur.   ACTIVITY  You may resume your regular activity, but move at a slower pace for the next 24 hours.   Take frequent rest periods for the next 24 hours.   Walking will help get rid of the air and reduce the bloated feeling in your belly (abdomen).   No driving for 24 hours (because of the medicine (anesthesia) used during the test).    Do not sign any important legal documents or operate any machinery for 24 hours (because of the anesthesia used during the test).  NUTRITION  Drink plenty of fluids.   You may resume your normal diet as instructed by your doctor.   Begin with a light meal and progress to your normal diet. Heavy or fried foods are harder to digest and may make you feel sick to your stomach (nauseated).   Avoid alcoholic beverages for 24 hours or as instructed.  MEDICATIONS  You may resume your normal medications unless your doctor tells you otherwise.  WHAT YOU CAN EXPECT TODAY  Some feelings of bloating in the abdomen.   Passage of more gas than usual.   Spotting of blood in your stool or on the toilet paper.  IF YOU HAD POLYPS REMOVED DURING THE COLONOSCOPY:  No aspirin products for 7 days or as instructed.  No alcohol for 7 days or as instructed.   Eat a soft diet for the next 24 hours.  FINDING OUT THE RESULTS OF YOUR TEST Not all test results are available during your visit. If your test results are not back during the visit, make an appointment with your caregiver to find out the results. Do not assume everything is normal if you have not heard from your caregiver or the medical facility. It is important for you to follow up on all of your test results.   SEEK IMMEDIATE MEDICAL ATTENTION IF:  You have more than a spotting of blood in your stool.   Your belly is swollen (abdominal distention).   You are nauseated or vomiting.   You have a temperature over 101.   You have abdominal pain or discomfort that is severe or gets worse throughout the day.   Your EGD showed moderate amount inflammation in your stomach.  Continue on twice daily omeprazole for 8 weeks and then decrease back to once daily thereafter.  I have taken biopsies to rule out infection with a bacteria called H. pylori.  Await pathology results, my office will contact you.  Also biopsied your small bowel to rule out celiac disease.  Colonoscopy was relatively unremarkable.  I did not find any polyps or evidence of colon cancer.  I would recommend we repeat this in 10 years for screening purposes.  Follow-up with GI in 3 months.  I hope you have a great rest of your week!  Hennie Duos. Marletta Lor, D.O. Gastroenterology and Hepatology [facility]

## 2020-01-25 NOTE — Transfer of Care (Signed)
Immediate Anesthesia Transfer of Care Note  Patient: Teresa Evans  Procedure(s) Performed: COLONOSCOPY WITH PROPOFOL (N/A ) ESOPHAGOGASTRODUODENOSCOPY (EGD) WITH PROPOFOL (N/A ) BIOPSY  Patient Location: Endoscopy Unit  Anesthesia Type:General  Level of Consciousness: drowsy  Airway & Oxygen Therapy: Patient Spontanous Breathing  Post-op Assessment: Report given to RN and Post -op Vital signs reviewed and stable  Post vital signs: Reviewed and stable  Last Vitals:  Vitals Value Taken Time  BP    Temp    Pulse    Resp    SpO2      Last Pain:  Vitals:   01/25/20 0739  TempSrc:   PainSc: 10-Worst pain ever      Patients Stated Pain Goal: 7 (01/25/20 8832)  Complications: No complications documented.

## 2020-01-25 NOTE — Op Note (Signed)
[facility] Patient Name: Teresa Evans Procedure Date: 01/25/2020 7:50 AM MRN: [id number] Date of Birth: 11-Jan-1971 Attending MD: Elon Alas. Abbey Chatters DO CSN: [id number] Age: 49 Admit Type: Outpatient Procedure:                Colonoscopy Indications:              Screening for colorectal malignant neoplasm Providers:                Elon Alas. Abbey Chatters, DO, Crystal Page, Wynonia Musty Tech, Technician Referring MD:              Medicines:                See the Anesthesia note for documentation of the                            administered medications Complications:            No immediate complications. Estimated blood loss:                            None. Estimated Blood Loss:     Estimated blood loss: none. Procedure:                Pre-Anesthesia Assessment:                           - The anesthesia plan was to use monitored                            anesthesia care (MAC).                           After obtaining informed consent, the colonoscope                            was passed under direct vision. Throughout the                            procedure, the patient's blood pressure, pulse, and                            oxygen saturations were monitored continuously. The                            [id number]-[id number] ([id number]) scope was introduced through                            the anus and advanced to the the terminal ileum,                            with identification of the appendiceal orifice and                            IC valve. The colonoscopy was performed without  difficulty. The patient tolerated the procedure                            well. The quality of the bowel preparation was                            evaluated using the BBPS [facility] Bowel Preparation                            Scale) with scores of: Right Colon = 3, Transverse                            Colon = 3 and Left Colon = 3 (entire mucosa  seen                            well with no residual staining, small fragments of                            stool or opaque liquid). The total BBPS score                            equals 9. Scope In: 7:52:29 AM Scope Out: 8:04:27 AM Scope Withdrawal Time: 0 hours 9 minutes 27 seconds  Total Procedure Duration: 0 hours 11 minutes 58 seconds  Findings:      The perianal and digital rectal examinations were normal.      Non-bleeding internal hemorrhoids were found during endoscopy.      The terminal ileum appeared normal.      The entire examined colon appeared normal. Impression:               - Non-bleeding internal hemorrhoids.                           - The examined portion of the ileum was normal.                           - The entire examined colon is normal.                           - No specimens collected. Moderate Sedation:      Per Anesthesia Care Recommendation:           - Patient has a contact number available for                            emergencies. The signs and symptoms of potential                            delayed complications were discussed with the                            patient. Return to normal activities tomorrow.                            Written discharge instructions  were provided to the                            patient.                           - Resume previous diet.                           - Continue present medications.                           - Repeat colonoscopy in 10 years for screening                            purposes.                           - Return to GI clinic in 3 months. Procedure Code(s):        --- Professional ---                           V4360, Colorectal cancer screening; colonoscopy on                            individual not meeting criteria for high risk Diagnosis Code(s):        --- Professional ---                           Z12.11, Encounter for screening for malignant                            neoplasm  of colon                           K64.8, Other hemorrhoids CPT copyright 2019 American Medical Association. All rights reserved. The codes documented in this report are preliminary and upon coder review may  be revised to meet current compliance requirements. Elon Alas. Abbey Chatters, DO [place] Abbey Chatters, DO 01/25/2020 8:07:14 AM This report has been signed electronically. Number of Addenda: 0

## 2020-01-25 NOTE — Anesthesia Postprocedure Evaluation (Signed)
Anesthesia Post Note  Patient: Syra Sirmons  Procedure(s) Performed: COLONOSCOPY WITH PROPOFOL (N/A ) ESOPHAGOGASTRODUODENOSCOPY (EGD) WITH PROPOFOL (N/A ) BIOPSY  Patient location during evaluation: Endoscopy Anesthesia Type: General Level of consciousness: awake and alert and oriented Pain management: pain level controlled Vital Signs Assessment: post-procedure vital signs reviewed and stable Respiratory status: spontaneous breathing, nonlabored ventilation and respiratory function stable Cardiovascular status: blood pressure returned to baseline and stable Postop Assessment: no apparent nausea or vomiting Anesthetic complications: no   No complications documented.   Last Vitals:  Vitals:   01/25/20 0649 01/25/20 0808  BP: 123/85 (!) 110/97  Pulse: 80 96  Resp: (!) 21 18  Temp: 36.9 C 36.4 C  SpO2: 95% 95%    Last Pain:  Vitals:   01/25/20 0808  TempSrc: Oral  PainSc: 0-No pain                 Julian Reil

## 2020-01-25 NOTE — Anesthesia Preprocedure Evaluation (Addendum)
Anesthesia Evaluation  Patient identified by MRN, date of birth, ID band Patient awake    Reviewed: Allergy & Precautions, H&P , NPO status , Patient's Chart, lab work & pertinent test results, reviewed documented beta blocker date and time   Airway Mallampati: II  TM Distance: >3 FB Neck ROM: full    Dental no notable dental hx. (+) Dental Advisory Given, Caps   Pulmonary neg pulmonary ROS, former smoker,    Pulmonary exam normal breath sounds clear to auscultation       Cardiovascular Exercise Tolerance: Good hypertension, negative cardio ROS   Rhythm:regular Rate:Normal     Neuro/Psych PSYCHIATRIC DISORDERS Anxiety Depression negative neurological ROS     GI/Hepatic Neg liver ROS, GERD  Medicated,  Endo/Other  negative endocrine ROS  Renal/GU negative Renal ROS  negative genitourinary   Musculoskeletal   Abdominal   Peds  Hematology negative hematology ROS (+)   Anesthesia Other Findings   Reproductive/Obstetrics negative OB ROS                            Anesthesia Physical Anesthesia Plan  ASA: II  Anesthesia Plan: General   Post-op Pain Management:    Induction:   PONV Risk Score and Plan: Propofol infusion  Airway Management Planned:   Additional Equipment:   Intra-op Plan:   Post-operative Plan:   Informed Consent: I have reviewed the patients History and Physical, chart, labs and discussed the procedure including the risks, benefits and alternatives for the proposed anesthesia with the patient or authorized representative who has indicated his/her understanding and acceptance.     Dental Advisory Given  Plan Discussed with: CRNA  Anesthesia Plan Comments:         Anesthesia Quick Evaluation

## 2020-01-25 NOTE — H&P (Signed)
Primary Care Physician:  Medicine, [facility] Primary Gastroenterologist:  Dr. Marletta Lor  Pre-Procedure History & Physical: HPI:  Teresa Evans is a 49 y.o. female is here for an EGD for chronic GERD and a colonoscopy to be performed for colon cancer screening purposes.    Patient denies any family history of colorectal cancer.  No melena or hematochezia.  No abdominal pain or unintentional weight loss.  No change in bowel habits. GERD relatively well controlled on PPI.   Past Medical History:  Diagnosis Date  . Anxiety   . Arthritis   . Depression   . GERD (gastroesophageal reflux disease)   . Hyperlipemia   . Hypertension     Past Surgical History:  Procedure Laterality Date  . CARPAL TUNNEL RELEASE Left 04/03/2018   Procedure: OPEN LEFT CARPAL TUNNEL RELEASE;  Surgeon: Mack Hook, MD;  Location: [facility];  Service: Orthopedics;  Laterality: Left;  . CESAREAN SECTION     x3  . CHOLECYSTECTOMY    . ENDOMETRIAL ABLATION W/ NOVASURE    . OPEN REDUCTION INTERNAL FIXATION (ORIF) DISTAL RADIAL FRACTURE Left 04/03/2018   Procedure: OPEN TREATMENT OF LEFT DISTAL RADIAL FRACTURE;  Surgeon: Mack Hook, MD;  Location: [facility];  Service: Orthopedics;  Laterality: Left;  . SHOULDER ARTHROSCOPY    . SHOULDER OPEN ROTATOR CUFF REPAIR    . TUBAL LIGATION      Prior to Admission medications   Medication Sig Start Date End Date Taking? Authorizing Provider  acetaminophen (TYLENOL) 500 MG tablet Take 1 tablet (500 mg total) by mouth every 6 (six) hours as needed. Patient taking differently: Take 500 mg by mouth every 6 (six) hours as needed for moderate pain. 11/04/19   Avegno, Zachery Dakins, FNP  clonazePAM (KLONOPIN) 1 MG tablet Take 1 mg by mouth 3 (three) times daily as needed for anxiety.    [provider]  ibuprofen (ADVIL) 800 MG tablet Take 1 tablet (800 mg total) by mouth 3 (three) times daily. Patient taking differently: Take 800 mg by  mouth every 8 (eight) hours as needed for moderate pain. 11/04/19   Avegno, Zachery Dakins, FNP  lisinopril (PRINIVIL,ZESTRIL) 10 MG tablet Take 10 mg by mouth daily.    [provider]  Multiple Vitamins-Minerals (MULTIVITAMIN WITH MINERALS) tablet Take 1 tablet by mouth daily.    [provider]  omeprazole (PRILOSEC) 40 MG capsule Take 40 mg by mouth in the morning and at bedtime.    [provider]  oxyCODONE (ROXICODONE) 5 MG immediate release tablet Take 1 tablet (5 mg total) by mouth every 6 (six) hours as needed (severe postop pain). 04/03/18   Mack Hook, MD  sertraline (ZOLOFT) 100 MG tablet Take 100 mg by mouth daily.    [provider]  simvastatin (ZOCOR) 40 MG tablet Take 40 mg by mouth daily.    [provider]  vitamin B-12 (CYANOCOBALAMIN) 1000 MCG tablet Take 1,000 mcg by mouth daily.    [provider]    Allergies as of 11/19/2019  . (No Known Allergies)    Family History  Problem Relation Age of Onset  . COPD Mother   . Cancer Mother        ?blood or bone cancer  . CVA Father   . Hypertension Brother   . Diabetes Mellitus II Brother   . Colon cancer Neg Hx     Social History   Socioeconomic History  . Marital status: Married  Spouse name: Not on file  . Number of children: Not on file  . Years of education: Not on file  . Highest education level: Not on file  Occupational History  . Not on file  Tobacco Use  . Smoking status: Former Smoker    Types: Cigarettes    Quit date: 02/09/2008    Years since quitting: 11.9  . Smokeless tobacco: Never Used  Vaping Use  . Vaping Use: Never used  Substance and Sexual Activity  . Alcohol use: Never  . Drug use: Never  . Sexual activity: Not on file  Other Topics Concern  . Not on file  Social History Narrative  . Not on file   Social Determinants of Health   Financial Resource Strain: Not on file  Food Insecurity: Not on file  Transportation Needs:  Not on file  Physical Activity: Not on file  Stress: Not on file  Social Connections: Not on file  Intimate Partner Violence: Not on file    Review of Systems: See HPI, otherwise negative ROS  Impression/Plan: Teresa Evans is here for an EGD for chronic GERD and a colonoscopy to be performed for colon cancer screening purposes.  The risks of the procedure including infection, bleed, or perforation as well as benefits, limitations, alternatives and imponderables have been reviewed with the patient. Questions have been answered. All parties agreeable.

## 2020-01-25 NOTE — Anesthesia Procedure Notes (Signed)
Date/Time: 01/25/2020 7:49 AM Performed by: Julian Reil, CRNA Pre-anesthesia Checklist: Patient identified, Emergency Drugs available, Suction available and Patient being monitored Patient Re-evaluated:Patient Re-evaluated prior to induction Oxygen Delivery Method: Nasal cannula Induction Type: IV induction Placement Confirmation: positive ETCO2

## 2020-01-28 ENCOUNTER — Other Ambulatory Visit: Payer: Self-pay

## 2020-01-28 LAB — SURGICAL PATHOLOGY

## 2020-02-05 ENCOUNTER — Encounter (HOSPITAL_COMMUNITY): Payer: Self-pay | Admitting: Internal Medicine

## 2020-04-23 ENCOUNTER — Encounter: Payer: Self-pay | Admitting: Gastroenterology

## 2020-04-23 NOTE — Progress Notes (Signed)
Referring Provider: Medicine, [facility] Internal Primary Care Physician:  Medicine, [facility] Internal Primary GI Physician: Dr. Marletta Lor  Chief Complaint  Patient presents with  . Gastroesophageal Reflux    Still having heartburn, hiccups. Wants to discuss EGD results as she did not understand the results    HPI:   Teresa Evans is a 50 y.o. female presenting today for follow-up of GERD.   Last seen in our office 11/15/2019 at time of initial consult for GERD and colon cancer screening.  GERD was fairly well controlled on omeprazole 40 mg twice daily unless eating spicy foods which would cause breakthrough symptoms.  Breakthrough presents as burning sensation in her chest.  She is taking 800 mg ibuprofen 3 times daily since breaking her left arm.  No significant lower GI symptoms.  Plan to proceed with EGD for Barrett's esophagus screening and colonoscopy.  She will continue her current medications.  Procedures 01/25/2020: EGD: Normal examined esophagus, gastritis s/p biopsy, normal duodenum s/p biopsy.  Duodenal biopsies benign.  Gastric biopsies with reactive gastropathy consistent with proton pump inhibitor effect, no H. pylori.  Recommended continuing PPI twice daily x8 weeks then decrease to once daily. Colonoscopy: Nonbleeding internal hemorrhoids, otherwise normal exam.  Repeat in 10 years.  Today:  Heartburn and indigestion "really bad". Worse at work than any other time. Breakthrough symptoms daily. OTC medications are unhelpful. States she will take a pain pill and this makes her feel better. States it makes her "not care." She is asking if I can prescribe pain medications. Explained that pain medications are not indicated for GERD or stress/anxiety and we would address GERD and she should discuss her anxiety with PCP.   No spicy foods. Chicken tenders rarely. [place] fries at least once a week. Drinking soda daily. 3, 12 oz cans daily.   No nausea, vomiting, or dysphagia. No abdominal  pain. No blood in the stools or black stools.   Ibuprofen every day for arthritis type pain. Generalized.   Past Medical History:  Diagnosis Date  . Anxiety   . Arthritis   . Depression   . GERD (gastroesophageal reflux disease)   . Hyperlipemia   . Hypertension     Past Surgical History:  Procedure Laterality Date  . BIOPSY  01/25/2020   Procedure: BIOPSY;  Surgeon: Lanelle Bal, DO;  [facility];  Service: Endoscopy;;  . CARPAL TUNNEL RELEASE Left 04/03/2018   Procedure: OPEN LEFT CARPAL TUNNEL RELEASE;  Surgeon: Mack Hook, MD;  Location: [facility];  Service: Orthopedics;  Laterality: Left;  . CESAREAN SECTION     x3  . CHOLECYSTECTOMY    . COLONOSCOPY WITH PROPOFOL N/A 01/25/2020   Surgeon: Earnest Bailey K, DO;  Nonbleeding internal hemorrhoids, otherwise normal exam.  Repeat in 10 year  . ENDOMETRIAL ABLATION W/ NOVASURE    . ESOPHAGOGASTRODUODENOSCOPY (EGD) WITH PROPOFOL N/A 01/25/2020    Surgeon: Lanelle Bal, DO; Normal examined esophagus, gastritis s/p biopsy, normal duodenum s/p biopsy.  Duodenal biopsies benign.  Gastric biopsies with reactive gastropathy consistent with proton pump inhibitor effect, no H. pylori.   . OPEN REDUCTION INTERNAL FIXATION (ORIF) DISTAL RADIAL FRACTURE Left 04/03/2018   Procedure: OPEN TREATMENT OF LEFT DISTAL RADIAL FRACTURE;  Surgeon: Mack Hook, MD;  Location: [facility];  Service: Orthopedics;  Laterality: Left;  . SHOULDER ARTHROSCOPY    . SHOULDER OPEN ROTATOR CUFF REPAIR    . TUBAL LIGATION      Current Outpatient  Medications  Medication Sig Dispense Refill  . acetaminophen (TYLENOL) 500 MG tablet Take 1 tablet (500 mg total) by mouth every 6 (six) hours as needed. (Patient taking differently: Take 500 mg by mouth every 6 (six) hours as needed for moderate pain.) 30 tablet 0  . clonazePAM (KLONOPIN) 1 MG tablet Take 1 mg by mouth 3 (three) times daily as needed for  anxiety.    Marland Kitchen dexlansoprazole (DEXILANT) 60 MG capsule Take 1 capsule (60 mg total) by mouth daily. 30 capsule 3  . ibuprofen (ADVIL) 800 MG tablet Take 1 tablet (800 mg total) by mouth 3 (three) times daily. (Patient taking differently: Take 800 mg by mouth every 8 (eight) hours as needed for moderate pain.) 30 tablet 0  . lisinopril (PRINIVIL,ZESTRIL) 10 MG tablet Take 10 mg by mouth daily.    . Multiple Vitamins-Minerals (MULTIVITAMIN WITH MINERALS) tablet Take 1 tablet by mouth daily.    Marland Kitchen oxyCODONE (ROXICODONE) 5 MG immediate release tablet Take 1 tablet (5 mg total) by mouth every 6 (six) hours as needed (severe postop pain). 28 tablet 0  . sertraline (ZOLOFT) 100 MG tablet Take 100 mg by mouth daily.    . simvastatin (ZOCOR) 40 MG tablet Take 40 mg by mouth daily.    . vitamin B-12 (CYANOCOBALAMIN) 1000 MCG tablet Take 1,000 mcg by mouth daily.     No current facility-administered medications for this visit.    Allergies as of 04/24/2020  . (No Known Allergies)    Family History  Problem Relation Age of Onset  . COPD Mother   . Cancer Mother        ?blood or bone cancer  . CVA Father   . Hypertension Brother   . Diabetes Mellitus II Brother   . Colon cancer Neg Hx     Social History   Socioeconomic History  . Marital status: Married    Spouse name: Not on file  . Number of children: Not on file  . Years of education: Not on file  . Highest education level: Not on file  Occupational History  . Not on file  Tobacco Use  . Smoking status: Former Smoker    Types: Cigarettes    Quit date: 02/09/2008    Years since quitting: 12.2  . Smokeless tobacco: Never Used  Vaping Use  . Vaping Use: Never used  Substance and Sexual Activity  . Alcohol use: Never  . Drug use: Never  . Sexual activity: Not on file  Other Topics Concern  . Not on file  Social History Narrative  . Not on file   Social Determinants of Health   Financial Resource Strain: Not on file  Food  Insecurity: Not on file  Transportation Needs: Not on file  Physical Activity: Not on file  Stress: Not on file  Social Connections: Not on file    Review of Systems: Gen: Denies fever, chills, cold or flulike symptoms, lightheadedness, dizziness. CV: Denies chest pain or palpitations. Resp: Denies dyspnea at rest or cough. GI: See HPI Heme: See HPI  Physical Exam: BP 133/80   Pulse 82   Temp (!) 97.1 F (36.2 C)   Ht 5\' 1"  (1.549 m)   Wt 208 lb 12.8 oz (94.7 kg)   BMI 39.45 kg/m  General:   Alert and oriented. No distress noted. Pleasant and cooperative.  Head:  Normocephalic and atraumatic. Eyes:  Conjuctiva clear without scleral icterus. Heart:  S1, S2 present without murmurs appreciated. Lungs:  Clear  to auscultation bilaterally. No wheezes, rales, or rhonchi. No distress.  Abdomen:  +BS, soft, non-tender and non-distended. No rebound or guarding. No HSM or masses noted. Msk:  Symmetrical without gross deformities. Normal posture. Extremities:  Without edema. Neurologic:  Alert and  oriented x4 Psych: Normal mood and affect.   Assessment: 50 year old female presenting today for follow-up of GERD.  She is currently taking omeprazole 40 mg twice daily and reports of breakthrough GERD symptoms daily.  Symptoms are worsened with stress.  Notably, dietary choices, body habitus, and chronic NSAID use likely contributing.  No alarm symptoms.  Recent EGD December 2021 with normal esophagus, gastritis s/p biopsy, normal examined duodenum s/p biopsy.  Gastric biopsies with reactive gastropathy consistent with PPI effect, no H. pylori.  Duodenal biopsies benign.  Notably, patient is asking for pain medications today to help control GERD symptoms.  I explained that pain medications are not indicated for management of GERD or management of stress/anxiety.   Plan: 1.  Stop omeprazole and start Dexilant 60 mg daily. 2.  Counseled on GERD diet/lifestyle.  Written instructions  provided. 3.  Encouraged weight loss.  Recommended 1-2 pound weight loss through diet and exercise.  Recommended my fitness pal app to assist with this. 4.  Avoid all NSAIDs. 5.  Use Tylenol as needed for pain.  No more than 3000 mg per 24 hours. 6.  Follow-up with PCP for management of mood fluctuations, anxiety, and generalized pain. 7.  Follow-up in 3 months.   Ermalinda Memos, PA-C [facility] 04/24/2020

## 2020-04-24 ENCOUNTER — Encounter: Payer: Self-pay | Admitting: Gastroenterology

## 2020-04-24 ENCOUNTER — Other Ambulatory Visit: Payer: Self-pay

## 2020-04-24 ENCOUNTER — Ambulatory Visit: Payer: BC Managed Care – PPO | Admitting: Gastroenterology

## 2020-04-24 VITALS — BP 133/80 | HR 82 | Temp 97.1°F | Ht 61.0 in | Wt 208.8 lb

## 2020-04-24 DIAGNOSIS — K219 Gastro-esophageal reflux disease without esophagitis: Secondary | ICD-10-CM | POA: Diagnosis not present

## 2020-04-24 MED ORDER — DEXLANSOPRAZOLE 60 MG PO CPDR: 60.0000 mg | DELAYED_RELEASE_CAPSULE | Freq: Every day | ORAL | 3 refills | Status: DC

## 2020-04-24 NOTE — Patient Instructions (Signed)
Stop omeprazole and start Dexilant 60 mg daily every morning.  I have sent a prescription to your pharmacy.  Please let me know if this medication is too expensive.  Follow a GERD diet/lifestyle:  Avoid fried, fatty, greasy, spicy, citrus foods. Avoid caffeine and carbonated beverages. Avoid chocolate. Try eating 4-6 small meals a day rather than 3 large meals. Do not eat within 3 hours of laying down. Prop head of bed up on wood or bricks to create a 6 inch incline.  Recommend 1-2# weight loss per week until ideal body weight through exercise & diet. Low fat/cholesterol diet.   Avoid sweets, sodas, fruit juices, sweetened beverages like tea, etc. Gradually increase exercise from 15 min daily up to 1 hr per day 5 days/week.  Try using my fitness pal app to help with weight loss.  Avoid all NSAID products including ibuprofen, Aleve, Advil, Goody powders, naproxen, and anything that says "NSAID" on the package.  Use Tylenol as needed for pain.  Do not exceed 3000 mg per 24 hours.  Please talk with primary care about medication options to help with your mood, anxiety, and generalized pain.  We will plan to see you back in about 3 months.  Please call with questions or concerns prior.   It was good to see you today!  Ermalinda Memos, PA-C [facility]

## 2020-05-31 ENCOUNTER — Emergency Department (HOSPITAL_COMMUNITY)
Admission: EM | Admit: 2020-05-31 | Discharge: 2020-05-31 | Disposition: A | Discharge status: Home / Self Care | Payer: PRIVATE HEALTH INSURANCE | Attending: Emergency Medicine | Admitting: Emergency Medicine

## 2020-05-31 ENCOUNTER — Encounter (HOSPITAL_COMMUNITY): Payer: Self-pay | Admitting: Emergency Medicine

## 2020-05-31 ENCOUNTER — Other Ambulatory Visit: Payer: Self-pay

## 2020-05-31 ENCOUNTER — Emergency Department (HOSPITAL_COMMUNITY): Payer: BC Managed Care – PPO

## 2020-05-31 ENCOUNTER — Emergency Department (HOSPITAL_COMMUNITY): Payer: PRIVATE HEALTH INSURANCE

## 2020-05-31 DIAGNOSIS — Y9289 Other specified places as the place of occurrence of the external cause: Secondary | ICD-10-CM | POA: Insufficient documentation

## 2020-05-31 DIAGNOSIS — S60222A Contusion of left hand, initial encounter: Secondary | ICD-10-CM | POA: Insufficient documentation

## 2020-05-31 DIAGNOSIS — S5012XA Contusion of left forearm, initial encounter: Secondary | ICD-10-CM | POA: Diagnosis not present

## 2020-05-31 DIAGNOSIS — I10 Essential (primary) hypertension: Secondary | ICD-10-CM | POA: Insufficient documentation

## 2020-05-31 DIAGNOSIS — Z87891 Personal history of nicotine dependence: Secondary | ICD-10-CM | POA: Insufficient documentation

## 2020-05-31 DIAGNOSIS — S20229A Contusion of unspecified back wall of thorax, initial encounter: Secondary | ICD-10-CM | POA: Diagnosis not present

## 2020-05-31 DIAGNOSIS — Z79899 Other long term (current) drug therapy: Secondary | ICD-10-CM | POA: Diagnosis not present

## 2020-05-31 DIAGNOSIS — W010XXA Fall on same level from slipping, tripping and stumbling without subsequent striking against object, initial encounter: Secondary | ICD-10-CM | POA: Diagnosis not present

## 2020-05-31 DIAGNOSIS — S3992XA Unspecified injury of lower back, initial encounter: Secondary | ICD-10-CM | POA: Diagnosis present

## 2020-05-31 DIAGNOSIS — Y990 Civilian activity done for income or pay: Secondary | ICD-10-CM | POA: Diagnosis not present

## 2020-05-31 MED ORDER — ACETAMINOPHEN 325 MG PO TABS
650.0000 mg | ORAL_TABLET | Freq: Once | ORAL | Status: AC
  Administered 2020-05-31: 650 mg via ORAL
  Filled 2020-05-31: qty 2

## 2020-05-31 NOTE — ED Notes (Signed)
Pt verbalized she takes percocet  Every 6 hrs prn for pain chronically for her left arm.

## 2020-05-31 NOTE — ED Provider Notes (Signed)
[facility] EMERGENCY DEPARTMENT Provider Note   CSN: [id number] Arrival date & time: 05/31/20  0932     History Chief Complaint  Patient presents with  . Fall    Teresa Evans is a 50 y.o. female.  Pt reports she tripped over a pallet at work.  Pt reports she his her back, neck and head.  Pt reports she landed on left hand and left forearm.  Pt denies loss of consciousness.  Pt reports back is sore all over.  Pt complains of soreness with walking. Pt has not taken anything for pain   The history is provided by the patient. No language interpreter was used.  Fall This is a new problem. The current episode started yesterday. The problem occurs constantly. Pertinent negatives include no chest pain and no abdominal pain. Nothing aggravates the symptoms. Nothing relieves the symptoms. She has tried nothing for the symptoms. The treatment provided no relief.       Past Medical History:  Diagnosis Date  . Anxiety   . Arthritis   . Depression   . GERD (gastroesophageal reflux disease)   . Hyperlipemia   . Hypertension     Patient Active Problem List   Diagnosis Date Noted  . GERD (gastroesophageal reflux disease) 11/15/2019  . Colon cancer screening 11/15/2019    Past Surgical History:  Procedure Laterality Date  . BIOPSY  01/25/2020   Procedure: BIOPSY;  Surgeon: Lanelle Bal, DO;  [facility];  Service: Endoscopy;;  . CARPAL TUNNEL RELEASE Left 04/03/2018   Procedure: OPEN LEFT CARPAL TUNNEL RELEASE;  Surgeon: Mack Hook, MD;  Location: [facility];  Service: Orthopedics;  Laterality: Left;  . CESAREAN SECTION     x3  . CHOLECYSTECTOMY    . COLONOSCOPY WITH PROPOFOL N/A 01/25/2020   Surgeon: Earnest Bailey K, DO;  Nonbleeding internal hemorrhoids, otherwise normal exam.  Repeat in 10 year  . ENDOMETRIAL ABLATION W/ NOVASURE    . ESOPHAGOGASTRODUODENOSCOPY (EGD) WITH PROPOFOL N/A 01/25/2020    Surgeon: Lanelle Bal, DO; Normal  examined esophagus, gastritis s/p biopsy, normal duodenum s/p biopsy.  Duodenal biopsies benign.  Gastric biopsies with reactive gastropathy consistent with proton pump inhibitor effect, no H. pylori.   . OPEN REDUCTION INTERNAL FIXATION (ORIF) DISTAL RADIAL FRACTURE Left 04/03/2018   Procedure: OPEN TREATMENT OF LEFT DISTAL RADIAL FRACTURE;  Surgeon: Mack Hook, MD;  Location: [facility];  Service: Orthopedics;  Laterality: Left;  . SHOULDER ARTHROSCOPY    . SHOULDER OPEN ROTATOR CUFF REPAIR    . TUBAL LIGATION       OB History   No obstetric history on file.     Family History  Problem Relation Age of Onset  . COPD Mother   . Cancer Mother        ?blood or bone cancer  . CVA Father   . Hypertension Brother   . Diabetes Mellitus II Brother   . Colon cancer Neg Hx     Social History   Tobacco Use  . Smoking status: Former Smoker    Types: Cigarettes    Quit date: 02/09/2008    Years since quitting: 12.3  . Smokeless tobacco: Never Used  Vaping Use  . Vaping Use: Never used  Substance Use Topics  . Alcohol use: Never  . Drug use: Never    Home Medications Prior to Admission medications   Medication Sig Start Date End Date Taking? Authorizing Provider  acetaminophen (TYLENOL) 500 MG  tablet Take 1 tablet (500 mg total) by mouth every 6 (six) hours as needed. Patient taking differently: Take 500 mg by mouth every 6 (six) hours as needed for moderate pain. 11/04/19   Avegno, Zachery Dakins, FNP  clonazePAM (KLONOPIN) 1 MG tablet Take 1 mg by mouth 3 (three) times daily as needed for anxiety.    [provider]  dexlansoprazole (DEXILANT) 60 MG capsule Take 1 capsule (60 mg total) by mouth daily. 04/24/20   Letta Median, PA-C  ibuprofen (ADVIL) 800 MG tablet Take 1 tablet (800 mg total) by mouth 3 (three) times daily. Patient taking differently: Take 800 mg by mouth every 8 (eight) hours as needed for moderate pain. 11/04/19   Avegno, Zachery Dakins, FNP   lisinopril (PRINIVIL,ZESTRIL) 10 MG tablet Take 10 mg by mouth daily.    [provider]  Multiple Vitamins-Minerals (MULTIVITAMIN WITH MINERALS) tablet Take 1 tablet by mouth daily.    [provider]  oxyCODONE (ROXICODONE) 5 MG immediate release tablet Take 1 tablet (5 mg total) by mouth every 6 (six) hours as needed (severe postop pain). 04/03/18   Mack Hook, MD  sertraline (ZOLOFT) 100 MG tablet Take 100 mg by mouth daily.    [provider]  simvastatin (ZOCOR) 40 MG tablet Take 40 mg by mouth daily.    [provider]  vitamin B-12 (CYANOCOBALAMIN) 1000 MCG tablet Take 1,000 mcg by mouth daily.    [provider]    Allergies    Patient has no known allergies.  Review of Systems   Review of Systems  Cardiovascular: Negative for chest pain.  Gastrointestinal: Negative for abdominal pain.  Musculoskeletal: Positive for back pain, myalgias and neck pain. Negative for joint swelling.  All other systems reviewed and are negative.   Physical Exam Updated Vital Signs BP 101/85   Pulse 73   Temp 98.2 F (36.8 C) (Oral)   Resp 20   Ht 5\' 1"  (1.549 m)   Wt 90.7 kg   SpO2 100%   BMI 37.79 kg/m   Physical Exam Vitals and nursing note reviewed.  Constitutional:      Appearance: She is well-developed.  HENT:     Head: Normocephalic.  Cardiovascular:     Rate and Rhythm: Normal rate.     Pulses: Normal pulses.  Pulmonary:     Effort: Pulmonary effort is normal.  Chest:     Chest wall: No tenderness.  Abdominal:     General: Abdomen is flat. There is no distension.     Palpations: Abdomen is soft.  Musculoskeletal:        General: Tenderness present. No swelling. Normal range of motion.     Cervical back: Normal range of motion.     Comments: Tender left wrist and forearm,  Normal range of motion with pain,   Spine  Diffusely tender full spine and full back   Skin:    General: Skin is warm.  Neurological:     General:  No focal deficit present.     Mental Status: She is alert and oriented to person, place, and time.  Psychiatric:        Mood and Affect: Mood normal.     ED Results / Procedures / Treatments   Labs (all labs ordered are listed, but only abnormal results are displayed) Labs Reviewed - No data to display  EKG None  Radiology DG Cervical Spine Complete  Result Date: 05/31/2020 CLINICAL DATA:  Fall with neck  pain EXAM: CERVICAL SPINE - COMPLETE 4+ VIEW COMPARISON:  Report from CT cervical spine dated 01/14/2013. FINDINGS: There is no evidence of cervical spine fracture or prevertebral soft tissue swelling. Mild focal kyphosis at C5-C6 may be positional or degenerative. Degenerative disc disease is most significant at C5-6 where there is moderate right and mild left neuroforaminal stenosis. IMPRESSION: No evidence of cervical spine fracture or static subluxation. Degenerative disc disease, most significant at C5-6. Electronically Signed   By: Romona Curls M.D.   On: 05/31/2020 11:45   DG Thoracic Spine 2 View  Result Date: 05/31/2020 CLINICAL DATA:  Fall with back pain EXAM: THORACIC SPINE 2 VIEWS COMPARISON:  None. FINDINGS: There is no evidence of thoracic spine fracture. Alignment is normal. Mild-to-moderate degenerative changes are seen in the spine. IMPRESSION: No acute findings in the thoracic spine. Electronically Signed   By: Romona Curls M.D.   On: 05/31/2020 11:47   DG Lumbar Spine Complete  Result Date: 05/31/2020 CLINICAL DATA:  Fall with back pain EXAM: LUMBAR SPINE - COMPLETE 4+ VIEW COMPARISON:  None. FINDINGS: There is no evidence of lumbar spine fracture. Alignment is normal. Mild multilevel degenerative disc disease is seen in the spine. IMPRESSION: No acute findings. Mild multilevel degenerative disc disease. Electronically Signed   By: Romona Curls M.D.   On: 05/31/2020 11:48   DG Forearm Left  Result Date: 05/31/2020 CLINICAL DATA:  Fall with forearm pain. EXAM: LEFT  FOREARM - 2 VIEW COMPARISON:  None. FINDINGS: The patient is status post open reduction internal fixation with plate and screws in the distal radius. There is no evidence of fracture or other focal bone lesions. Soft tissues are unremarkable. IMPRESSION: Negative. Electronically Signed   By: Romona Curls M.D.   On: 05/31/2020 11:50   DG Hand Complete Left  Result Date: 05/31/2020 CLINICAL DATA:  Fall with and pain. EXAM: LEFT HAND - COMPLETE 3+ VIEW COMPARISON:  Wrist fluoroscopy images dated 04/03/2018. FINDINGS: The patient is status post open reduction internal fixation with plate and screws across the distal radius. There is no evidence of fracture or dislocation. There is no evidence of arthropathy or other focal bone abnormality. Soft tissues are unremarkable. IMPRESSION: No acute osseous injury. Electronically Signed   By: Romona Curls M.D.   On: 05/31/2020 11:52    Procedures Procedures   Medications Ordered in ED Medications  acetaminophen (TYLENOL) tablet 650 mg (has no administration in time range)    ED Course  I have reviewed the triage vital signs and the nursing notes.  Pertinent labs & imaging results that were available during my care of the patient were reviewed by me and considered in my medical decision making (see chart for details).    MDM Rules/Calculators/A&P                          MDM:  Xray c spine, tspine and ls  No fracture.  Forearm and hand no fracture.   Pt advised ice.  Tylenol for pain  Final Clinical Impression(s) / ED Diagnoses Final diagnoses:  Contusion of back, unspecified laterality, initial encounter  Contusion of left forearm, initial encounter  Contusion of left hand, initial encounter    Rx / DC Orders ED Discharge Orders    None    An After Visit Summary was printed and given to the patient.    Osie Cheeks 05/31/20 1220    Vanetta Mulders, MD 06/01/20 [phone]

## 2020-05-31 NOTE — Discharge Instructions (Addendum)
Follow up with your Physician for recheck  

## 2020-05-31 NOTE — ED Triage Notes (Signed)
Pt fell yesterday at work. Tripped over a pallet, landing on her left side. C/o pain to her neck, back, left forearm, left wrist, and left hip. Did hit her head, denies loc.

## 2020-06-12 ENCOUNTER — Encounter: Payer: Self-pay | Admitting: Gastroenterology

## 2020-07-30 ENCOUNTER — Ambulatory Visit: Payer: BC Managed Care – PPO | Admitting: Gastroenterology

## 2020-10-20 ENCOUNTER — Ambulatory Visit: Payer: BC Managed Care – PPO | Admitting: Gastroenterology

## 2020-12-24 IMAGING — DX DG FOREARM 2V*R*
2 series · 2 of 2 positions shown · non-contrast
Comparison: None.

CLINICAL DATA: Fall last night. Right forearm injury and pain.
Initial encounter.

EXAM:
RIGHT FOREARM - 2 VIEW

[forearm ap]
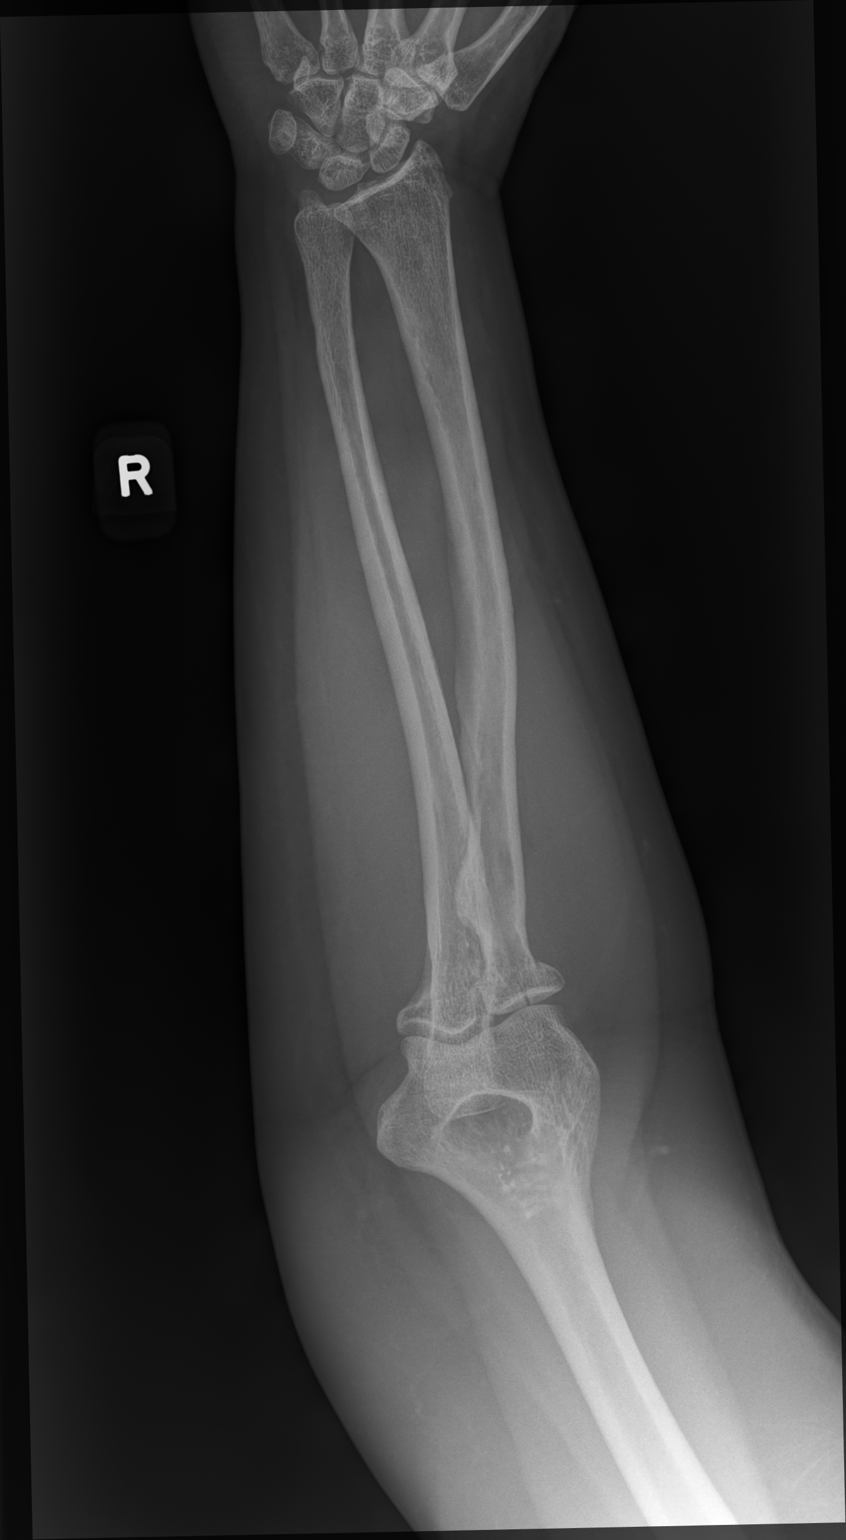

[forearm lat]
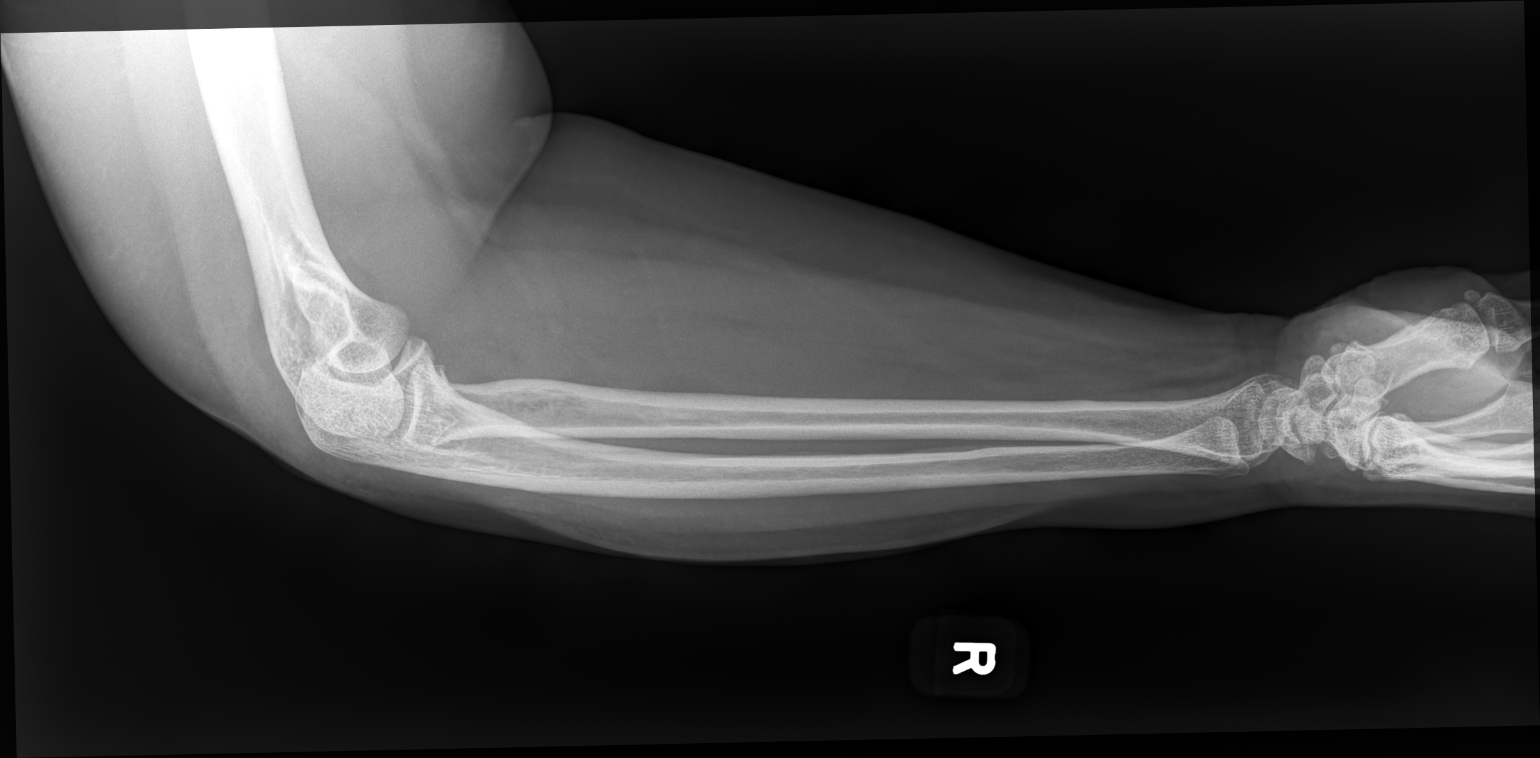

[2 of 2 positions shown; findings below may reference images not displayed]

FINDINGS: Minimally displaced fracture is seen involving the radial head and
neck. No other fractures are seen involving the forearm.
IMPRESSION: Minimally displaced fracture of the radial head and neck. Consider
dedicated elbow radiographs for further evaluation.

## 2021-04-16 NOTE — Progress Notes (Signed)
 Patient presents for her well woman exam, pap smear today. Denies history of abnormal pap smears. She has her mammogram scheduled for this month. Last colonoscopy was in 2021.

## 2021-04-16 NOTE — Progress Notes (Signed)
 Subjective   CC: Annual Exam  HPI: Teresa Evans is a 51 y.o. y/o G3P3003 who presents today for annual exam, new patient to establish care   She denies any postmenopausal bleeding. She is not sexually active.. Denies dyspareunia.  She denies to hypoestrogenic symptoms.  She completes her self breast exam monthly and has not found any abnormalities.  She  declined STD screen.   Last pap smear 4 years ago.   She had a mammogram 2022, scheduled for this month..  She had a colonoscopy on 2021, repeat in 10 years.  PCP Sherrilee Clause, NP.  She has concerns today.  -Working with PCP to manage depression and stressful life event after the unexpected loss of her son in November.  She has never worked with a Environmental consultant, but is interested in doing so.  Denies any suicidal thoughts or ideations. ______________________________________________________________________  No LMP recorded (lmp unknown). Patient is postmenopausal.   Current Outpatient Medications:  .  citalopram (CELEXA) 20 MG tablet, Take 1 tablet (20 mg total) by mouth daily., Disp: , Rfl:  .  clonazePAM  (KLONOPIN ) 1 MG tablet, , Disp: , Rfl:  .  HYDROcodone-acetaminophen  (NORCO) 5-325 mg per tablet, , Disp: , Rfl:  .  lisinopril (PRINIVIL,ZESTRIL) 10 MG tablet, Take 1 tablet (10 mg total) by mouth daily., Disp: , Rfl:  .  omeprazole (PRILOSEC) 40 MG capsule, Take 1 capsule (40 mg total) by mouth., Disp: , Rfl:  .  oxyCODONE -acetaminophen  (PERCOCET) 5-325 mg per tablet, Take 1 tablet by mouth every eight (8) hours as needed for pain., Disp: 21 tablet, Rfl: 0 .  sertraline  (ZOLOFT ) 100 MG tablet, , Disp: , Rfl:  .  simvastatin (ZOCOR) 20 MG tablet, , Disp: , Rfl:   No Known Allergies  Past Medical History:  Diagnosis Date  . Hyperlipidemia   . Hypertension     Past Surgical History:  Procedure Laterality Date  . BTL    . CESAREAN SECTION    . CESAREAN SECTION    . CESAREAN SECTION    . ENDOMETRIAL  ABLATION W/ NOVASURE    . SHOULDER ARTHROSCOPY    . SHOULDER SURGERY      OB History  Gravida Para Term Preterm AB Living  3 3 3     3   SAB IAB Ectopic Molar Multiple Live Births            3    # Outcome Date GA Lbr Len/2nd Weight Sex Delivery Anes PTL Lv  3 Term 2000    M CS-LTranv   LIV  2 Term 1993    F CS-LTranv   LIV  1 Term 34    F CS-LTranv   LIV    Family History  Problem Relation Age of Onset  . Osteoarthritis Mother   . Other Mother        multiple myeloma  . Diabetes Mother   . Hypertension Mother   . COPD Mother   . Cancer Mother   . Melanoma Mother   . Diabetes Father   . Hypertension Father   . Stroke Father     Social Determinants of Health   Food Insecurity: Not on file  Tobacco Use: Low Risk   . Smoking Tobacco Use: Never  . Smokeless Tobacco Use: Never  . Passive Exposure: Not on file  Transportation Needs: Not on file  Alcohol Use: Not At Risk  . How often do you have a drink containing alcohol?: Never  .  How many drinks containing alcohol do you have on a typical day when you are drinking?: 1 - 2  . How often do you have 5 or more drinks on one occasion?: Never  Housing/Utilities: Not on file  Substance Use: Low Risk   . Taken prescription drugs for non-medical reasons: Never  . Taken illegal drugs: Never  . Patient indicated they have taken drugs in the past year for non-medical reasons: Yes, [positive answer(s)]: Not on file  Financial Resource Strain: Not on file  Physical Activity: Not on file  Health Literacy: Low Risk   . : Never  Stress: Not on file  Intimate Partner Violence: Not At Risk  . Fear of Current or Ex-Partner: No  . Emotionally Abused: No  . Physically Abused: No  . Sexually Abused: No  Depression: Not at risk  . PHQ-2 Score: 2  Social Connections: Not on file      GYNECOLOGIC HISTORY: -Hx of STI: No -Hx of abnormal Pap: No.    REVIEW OF SYSTEMS: General: no fevers Skin: no rashes, lesions,  itching, HEENT: no frequent headaches, hearing changes, sore throat, dental problems, sinus problems  Lymph: no tender or swollen lymph nodes  Breasts: no lumps, nipple discharge, breast pain Resp: no SOB, cough, wheezing Cardio: no chest pain, palpitations, edema HP:[id number], bloating, reflux, blood in stool, leakage of stool  GU: no frequency, dysuria, flank pain, hematuria, incontinence, retention       Musk: no weakness, arthralgia, joint swelling, ROM limitations Endocrine: no polydypsia, polyphagia, polyuria, heat/cold intolerance, hot flashes, dry skin Psych: no depression, anxiety, panic attacks  Objective    PHYSICAL EXAM: BP 128/86   Ht 154.9 cm (5' 1)   Wt (!) 101.7 kg (224 lb 3.2 oz)   LMP  (LMP Unknown) Comment: has only had one period this year.  BMI 42.36 kg/m  Constitutional: Well-developed, well-nourished female in no acute distress, good eye contact, appropriate emotional responses. Neurological: Alert and oriented to person, place, and time Psychiatric: Mood and affect appropriate Skin: No rashes or lesions Neck: Supple without masses. Trachea is midline.Thyroid is normal size without masses Lymphatics: No cervical, axillary, supraclavicular, or inguinal adenopathy noted Respiratory: Clear to auscultation bilaterally. Good air movement with normal work of  breathing. Cardiovascular: Regular rate and rhythm. Extremities grossly normal, nontender with no edema; pulses regular Gastrointestinal: Soft, nontender, nondistended. No masses or hernias appreciated. No hepatosplenomegaly. No fluid wave. No rebound or guarding. Breast Exam: No tenderness, no dominant masses, no nipple discharge or nipple abnormality, no LAD Genitourinary:         External Genitalia: Normal female genitalia, no lesions    Urethra: Midline, no masses    Bladder: Well-suspended, NT    Vagina: Flattened vaginal rugae, no lesions, no discharge.     Cervix: No lesions, normal size and  consistency; no cervical motion tenderness    Uterus: Normal size and contour; smooth, mobile, NT Adnexa/Parametria: No masses; no parametrial nodularity; no tenderness Perineum/Anus: No lesions  Chaperone present for exam, Rosina Buffalo LPN.   ________________________________ Assessment/Plan:   Impression:  Alesana Magistro is a 51 y.o. y/o G3P3003 who presents today for annual exam  Plan: >Cervical cancer screening: Up to date. - Pap smear today.  We will call patient with any abnormal results. - Continue routine screening.  > Please notify the office for any postmenopausal bleeding.   > Breast cancer screening:  Up to date - Patient instructed on monthly self breast exam, notify office for  abnormal findings. -Primary prevention for breast cancer includes participating in regular exercise, limiting alcohol consumption and maintaining a normal BMI.  > Colon cancer screening: Up-to-date. - USPSTF recommends screening colonoscopy colonoscopy every 10 years beginning at age 25.  > Osteoporosis screening: - DEXA in the perimenopausal period - Advised Calcium and Vitamin D supplementation  > The American Heart Association recommends 150 minutes of moderate intensity exercise as well as strength resistance exercises weekly.   > RTC in 1 year or as needed.  1. Encounter for well woman exam  - Pap Smear; Future - Pap Smear  2. Menopause   3. History of recent stressful life event -Patient plans to continue depression management with PCP. -Encourage consideration of therapy, resources provided to the patient for her to establish care.  Encouraged her to call us  with any questions or concerns. -Denies any suicidal thoughts or ideations, if these are to occur please present to emergency room immediately or call 911.    Burnard Lennie Amas, FNP

## 2021-04-20 NOTE — Progress Notes (Signed)
Your Pap Smear was normal. Please let the office know if you have questions!

## 2021-07-21 IMAGING — DX DG LUMBAR SPINE COMPLETE 4+V
5 series · 5 of 5 positions shown · non-contrast
Comparison: None.

CLINICAL DATA: Fall with back pain

EXAM:
LUMBAR SPINE - COMPLETE 4+ VIEW

[l-spine ap]
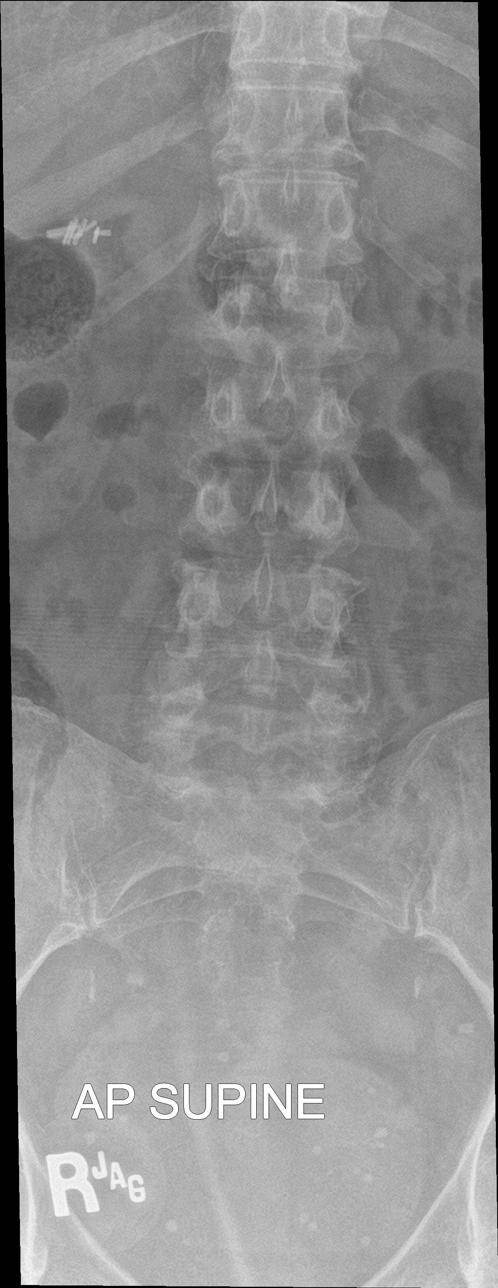

[l-spine obl (1 of 2)]
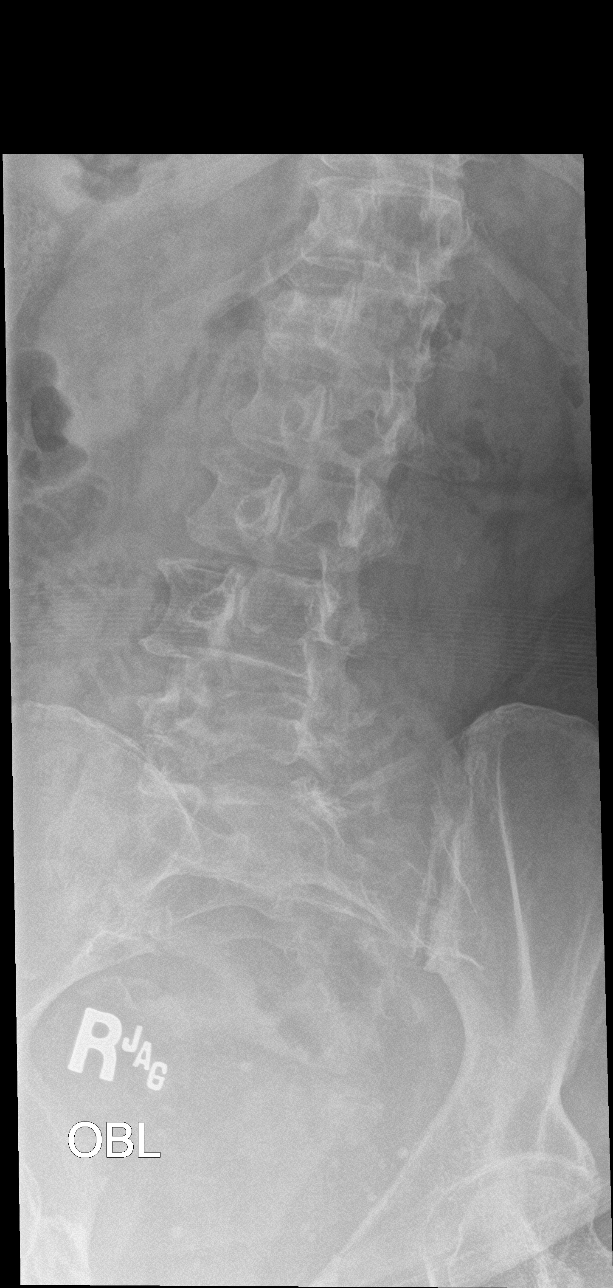

[l-spine obl (2 of 2)]
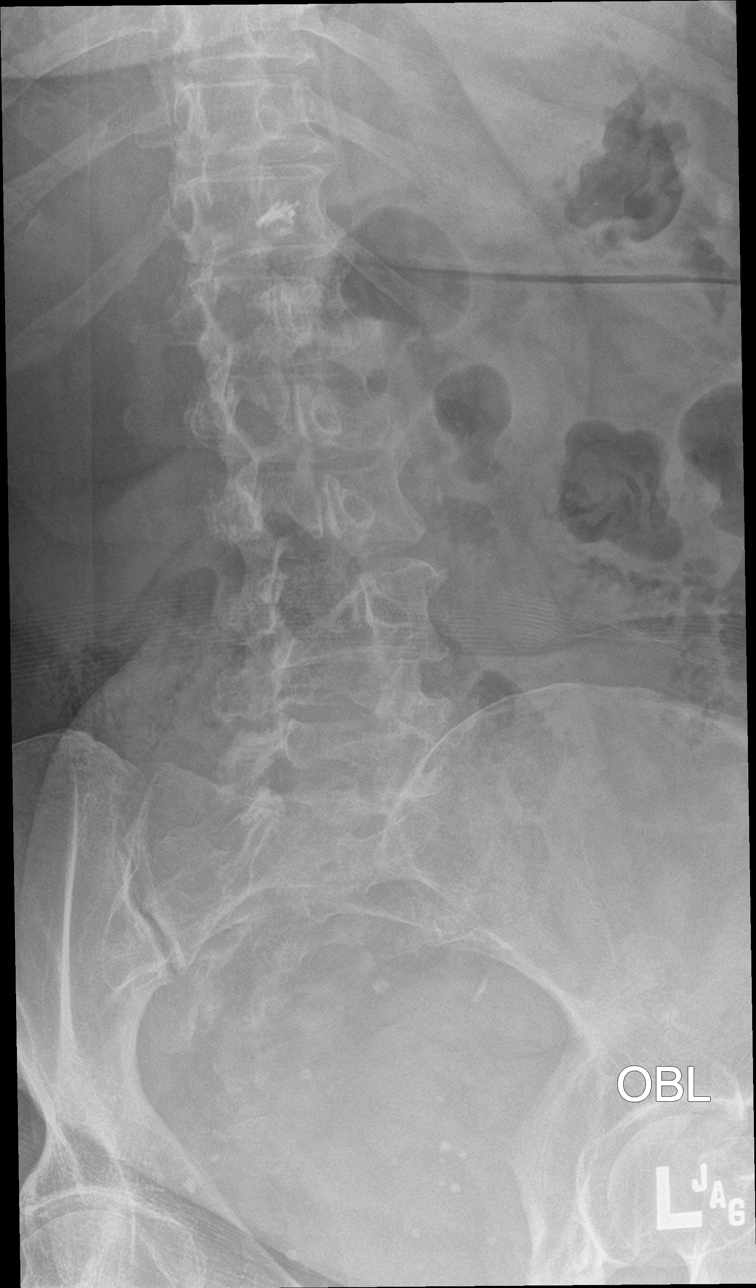

[l-spine lat]
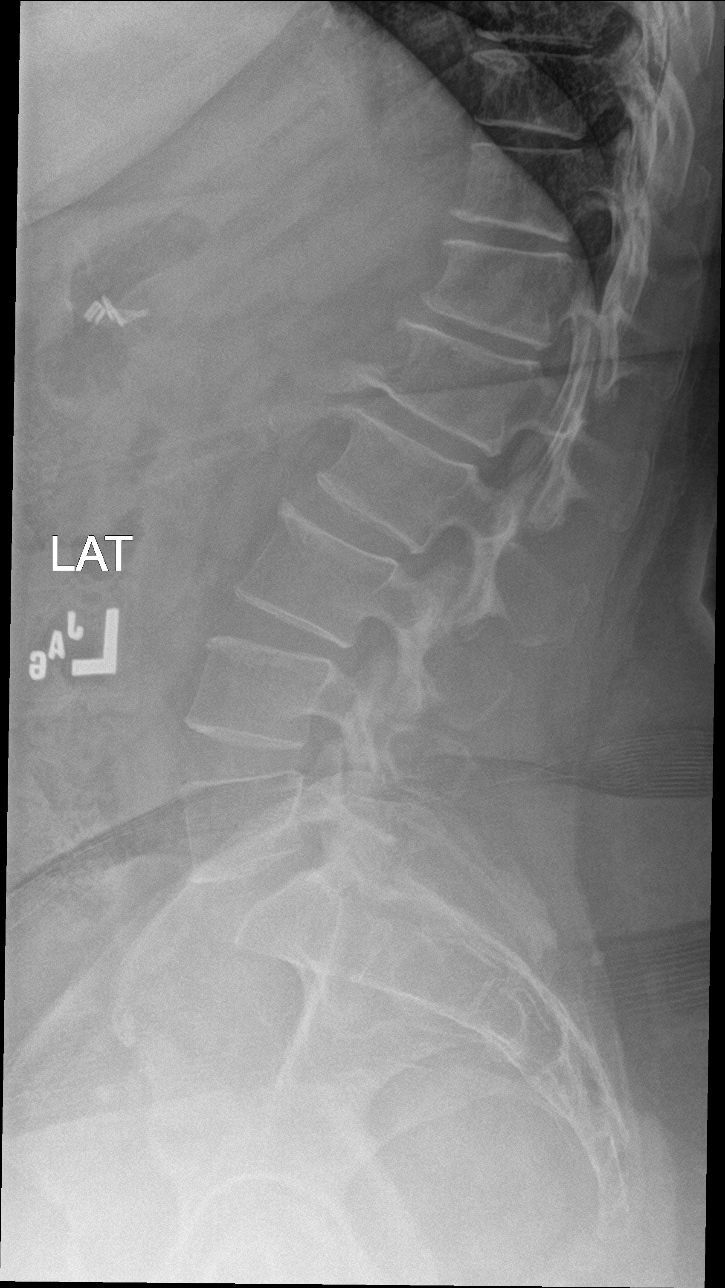

[l-spine spot]
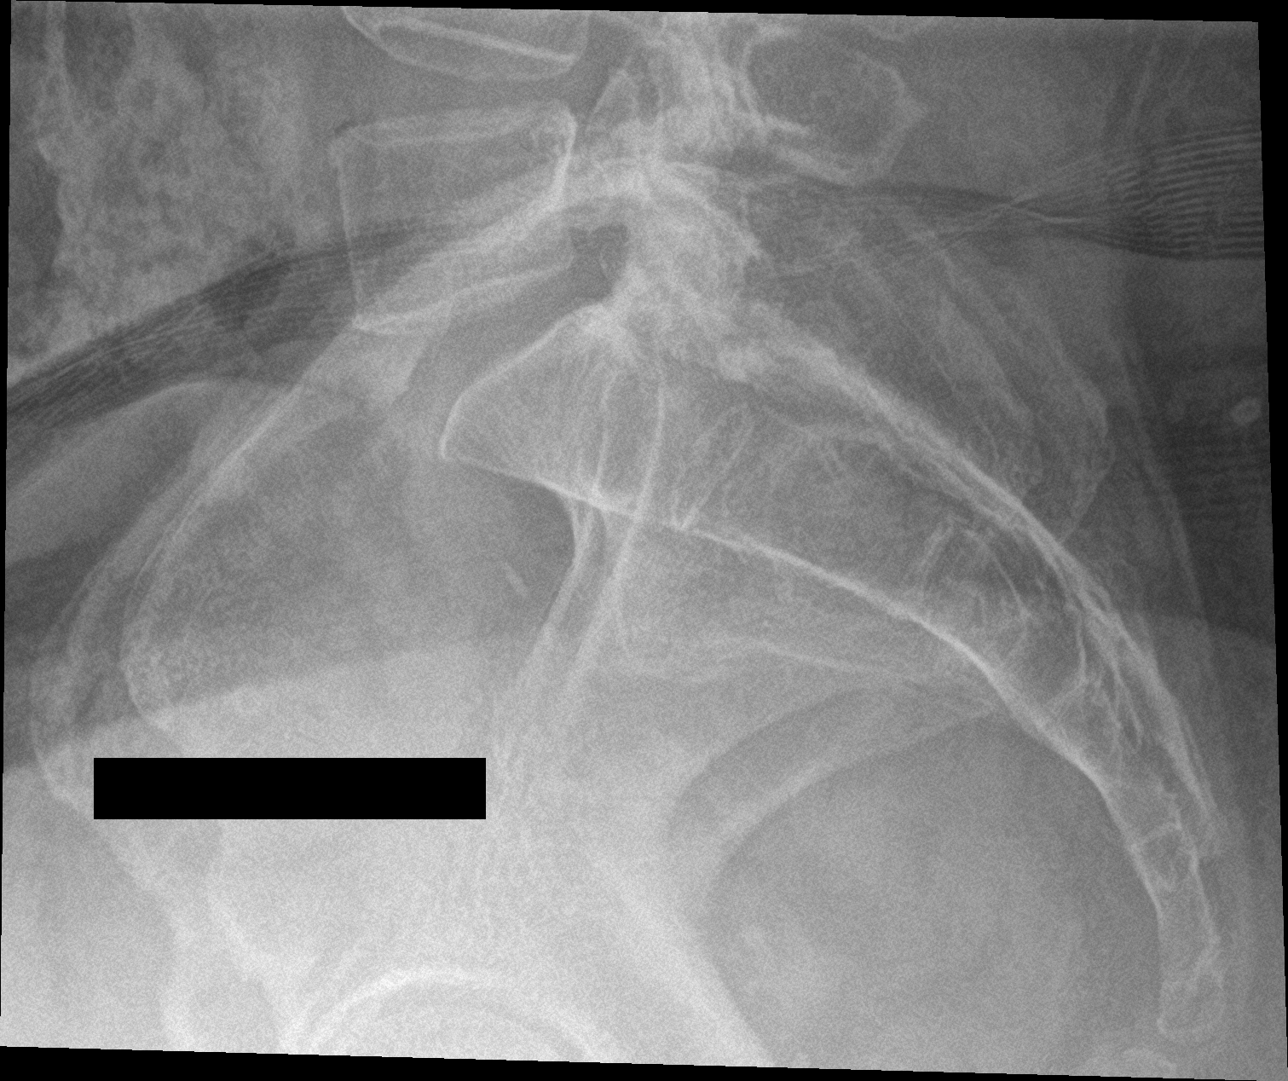

[5 of 5 positions shown; findings below may reference images not displayed]

FINDINGS: There is no evidence of lumbar spine fracture. Alignment is normal.
Mild multilevel degenerative disc disease is seen in the spine.
IMPRESSION: No acute findings. Mild multilevel degenerative disc disease.

## 2022-01-20 NOTE — Progress Notes (Signed)
 Teresa Evans, DOB 20-Jul-1970 is a pleasant 52 year old morbidly obese female who presents with a history of chronic diffuse pain with cervicalgia and chronic bilateral low back pain with bilateral sciatica and radiation of pain into the arms and legs beginning around April 2022.  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  Since that time she has described chronic diffuse pain that worsens with any activity and minimally improves with rest.  She has seen several pain providers including Dr. Josepha and Nena Gull and has had multiple injections with only marginal benefit.  She continues on Percocet 10-325 mg 3 times a day as well as Zanaflex 4 mg at night and Klonopin  0.5 mg 3 times daily.  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  They required her to stand for her job but she feels that she gets intensification of pain as well as tingling in her legs with prolonged standing.  She has started walking with a single-point cane on the right over the last 2 weeks.  She describes diffuse tingling and aching on burning on sharp pains that are 9 out of 10 constantly despite medications.  She has a prior history of ORIF of the left wrist fracture approximately 2 years ago after another fall.  She fell a second time onto the wrist but had that treated with occupational therapy and an injection with moderate benefit.  She presents with a left wrist splint in place.  She denies bowel or bladder dysfunction and denies other constitutional complaints.  No past medical history on file. No past surgical history on file. No family history on file. Social History   Socioeconomic History  . Marital status: Unknown  Tobacco Use  . Smoking status: Former    Packs/day: 0.25    Years: 10.00    Additional pack years: 0.00     Total pack years: 2.50    Types: Cigarettes    Passive exposure: Never  . Smokeless tobacco: Never  Vaping Use  . Vaping Use: Never used  Substance and Sexual Activity  . Alcohol use: Never  . Drug use: Never   No Known Allergies Current Outpatient Medications on File Prior to Visit  Medication Sig Dispense Refill  . atorvastatin (LIPITOR) 40 mg tablet Take one tablet (40 mg dose) by mouth daily.    . clonazePAM  (KLONOPIN ) 0.5 mg tablet     . lisinopril (PRINIVIL,ZESTRIL) 10 mg tablet one tablet (10 mg dose).    . Multiple Vitamins-Minerals (MULTIVITAMIN WITH MINERALS) tablet Take one tablet by mouth daily.    SABRA omeprazole (PRILOSEC) 40 mg capsule Take one capsule (40 mg dose) by mouth.    . oxyCODONE -acetaminophen  (PERCOCET,ENDOCET) 10-325 mg per tablet Take one tablet by mouth 3 (three) times a day as needed.    . sertraline  (ZOLOFT ) 100 mg tablet Take by mouth.    . simvastatin (ZOCOR) 40 mg tablet Take one tablet (40 mg dose) by mouth daily.    SABRA tiZANidine (ZANAFLEX) 4 mg tablet Take one tablet (4 mg dose) by mouth 3 (three) times a day.     No current facility-administered medications on file prior to visit.     Review of Systems Pertinent items are noted in HPI.    Exam: Vitals:   01/20/22 1113  BP: 125/90  Pulse:  Temp:   SpO2:      General appearance: Alert, cooperative, no apparent distress, appears stated age  Head: Normocephalic, without obvious abnormality, atraumatic Eyes: Conjunctive and corneas are clear, extraocular movements are intact bilaterally Neck: Supple, symmetrical, no meningismus, trachea midline Lungs: Clear to auscultation bilaterally, respirations unlabored Heart: Regular rate and rhythm with no murmurs or rubs Extremities: Normal, atraumatic extremities with no stenosis, clubbing, or edema Pulses: 2+ and symmetric in all extremities Skin: Warm, dry, free of rashes or other lesions to exposed skin  Neurologic: Strength is breakaway in  all groups because of pain against resistance testing but is at least 4/5 in all groups.  Sensation is preserved to light touch, vibration, and proprioception and to all other modalities tested.  Spurling's maneuver is painful with range of motion but without classic radicular pain.  Straight leg raise and Patrick's maneuver elicit back pain without classic radicular pain.  Ambulation is a waddling side-to-side gait despite a single-point cane on the right.  She has negative Tinel's at the wrists positive Tinel's at the left medial epicondyle.  She has negative Phalen's maneuvers.  Cranial nerves II through XII are intact.  Speech is fluent.  Cognition is normal.  Cerebellar testing is benign.  Deep tendon reflexes are trace despite reinforcement.   MRI imaging of the lumbar spine from [facility] 08/13/2020: FINDINGS:    Alignment: Mildly exaggerated lumbar lordosis.   Vertebrae:  Vertebral body heights are maintained. No marrow signal abnormalities to suggest neoplasm.   Conus medullaris:  In normal position. Normal signal and contour.   Degenerative changes: Multilevel facet and ligamentum flavum hypertrophy.   T12-L1:  Diffuse disc bulge with no substantial canal or foraminal stenosis.  L1-L2:  Diffuse disc bulge with no substantial canal or foraminal stenosis.  L2-L3:  Small dorsal disc bulge with no substantial canal or foraminal stenosis. Mild left facet joint effusion.  L3-L4:  Diffuse disc bulge with no substantial canal or foraminal stenosis. Mild left facet joint effusion.  L4-L5:  Diffuse disc bulge with no substantial canal or foraminal stenosis.  L5-S1:  Diffuse disc bulge with no substantial canal stenosis. Mild bilateral foraminal stenosis. Mild left facet joint effusion.   Upper Sacrum:  No focal lesion identified.   MRI imaging of the cervical spine 11/11/2020 shows uncovertebral spurring at C4-5 with severe right foraminal stenosis.  Spondylosis with  uncovertebral spurring at C5-6 is also associated with severe bilateral foraminal stenosis.  Other imaged levels are essentially benign.  Assessment and plan: Examination and history are consistent with chronic diffuse pain in the neck and interscapular back and thoracolumbar back and low back into the bilateral arms and legs in this patient who describes a fall while working at [employer] in April 2022 onto her low back and buttocks.  She was apparently released from [employer], having been told that there was nothing further they could do for her.  She currently works as a Holiday representative at [employer] but has increased pain with prolonged standing.  I would recommend EMG nerve conduction studies of all extremities as well as cervical and lumbar x-rays including flexion and extension views to guide decision making.  I have spent over 50 minutes in evaluation, management, and counseling today. The majority of the time did relate to counseling regarding discussion with her of her cervical and lumbar MRI imaging results and discussion of options and alternatives for further evaluation and potential intervention.  (Please note voice recognition software has been  used to dictate this note. Similar sounding words can inadvertently be transcribed and may not be corrected upon review).

## 2023-06-27 ENCOUNTER — Encounter (HOSPITAL_COMMUNITY): Payer: Self-pay

## 2023-06-27 ENCOUNTER — Ambulatory Visit (HOSPITAL_COMMUNITY): Admitting: Registered Nurse

## 2023-09-22 ENCOUNTER — Ambulatory Visit (HOSPITAL_COMMUNITY): Admitting: Registered Nurse

## 2023-09-22 ENCOUNTER — Encounter (HOSPITAL_COMMUNITY): Payer: Self-pay | Admitting: Registered Nurse

## 2023-09-22 DIAGNOSIS — F331 Major depressive disorder, recurrent, moderate: Secondary | ICD-10-CM

## 2023-09-22 DIAGNOSIS — G4700 Insomnia, unspecified: Secondary | ICD-10-CM | POA: Diagnosis not present

## 2023-09-22 DIAGNOSIS — Z79899 Other long term (current) drug therapy: Secondary | ICD-10-CM | POA: Diagnosis not present

## 2023-09-22 DIAGNOSIS — F411 Generalized anxiety disorder: Secondary | ICD-10-CM | POA: Diagnosis not present

## 2023-09-22 MED ORDER — BUSPIRONE HCL 5 MG PO TABS: 5.0000 mg | ORAL_TABLET | Freq: Two times a day (BID) | ORAL | 1 refills | Status: AC

## 2023-09-22 MED ORDER — CLONAZEPAM 0.25 MG PO TBDP: ORAL_TABLET | ORAL | 0 refills | Status: AC

## 2023-09-22 MED ORDER — HYDROXYZINE PAMOATE 25 MG PO CAPS: 25.0000 mg | ORAL_CAPSULE | Freq: Three times a day (TID) | ORAL | 1 refills | Status: AC | PRN

## 2023-09-22 MED ORDER — SERTRALINE HCL 200 MG PO CAPS: 100.0000 mg | ORAL_CAPSULE | Freq: Every day | ORAL | 1 refills | Status: DC

## 2023-09-22 MED ORDER — ARIPIPRAZOLE 2 MG PO TABS: 2.0000 mg | ORAL_TABLET | Freq: Every day | ORAL | 1 refills | Status: AC
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
## 2023-09-22 NOTE — Patient Instructions (Addendum)
 If no one has contacted, you by the end of business day today please call the appropriate office listed below to schedule your next visit for medication management with Luisa Ruder, NP:    [facility] [address], [place], [place] [address]  5.4 mi Phone: [phone] (Call to schedule appointment)  [facility] at [facility] [address] [place], [place] [address]  25 mi Phone: [phone] (Call to schedule appointment)  Klonopin  Taper:  You are prescribed a 0.25 mg tablet to adjust accordingly to taper as listed below: Week 1:  Take 0.25 mg (1 tablet) four times a day as needed for 7 days.   Week 2:  Take 0.25 mg (1 tablet) Three times a day as needed for 7 days.  Week 3:  Take 0.25 mg (1 tablet) Two times daily as needed for 7 days. Week 4:  Take 0.25 mg (1 tablet) once daily as needed for 7 days.  Week 5:  Take 0.25 mg (1 tablet) once every other day as needed for 7 days then stop/discontinue.    Long-term use of benzodiazepines can lead to several adverse effects, including: Cognitive Impairment: Issues such as memory problems, decreased attention, and impaired cognitive abilities. Physical Health Risks: Increased risk of falls and fractures, especially in older adults.  Mental Health Issues: Mood swings, depression, and increased anxiety.  Dependence and Withdrawal: Physical dependence can develop, leading to withdrawal symptoms like irritability, sleep disturbances, and flu-like symptoms.  Social and Occupational Impact: Problems with employment and social interactions.  Gradual reduction under medical supervision is recommended to mitigate withdrawal symptoms.  Alternatives There are several alternatives to benzodiazepines for managing anxiety and related conditions. Some of the common options include: Selective Serotonin Reuptake Inhibitors (SSRIs): These medications, such as  sertraline  and fluoxetine, are often used as first-line treatments for anxiety disorders.  Serotonin-Norepinephrine Reuptake Inhibitors (SNRIs): Examples include venlafaxine and duloxetine, which are also effective for anxiety.  Buspirone : This is a non-benzodiazepine medication specifically for anxiety, with a lower risk of dependence.  Beta-Blockers: Medications like propranolol can help manage physical symptoms of anxiety, such as rapid heartbeat.  Pregabalin and Gabapentin: These are used for anxiety and have a different mechanism of action compared to benzodiazepines.  Hydroxyzine : An antihistamine that can be used for anxiety.   Topic: Benzodiazepines and Opioids Polysubstance Use Risk: Combining opioids with other central nervous system depressants like benzodiazepines, alcohol, or xylazine significantly increases the risk of life-threatening overdose. Statistics: In 2021, nearly 14% of opioid-related overdose deaths also involved benzodiazepines. Mechanism: Benzodiazepines (e.g., Valium, Xanax, Klonopin ) enhance GABA activity in the brain, leading to sedation. Illicit Drug Supply: Benzodiazepines have been found in illicit opioids, sometimes unknowingly consumed. Co-Prescribing Concerns Overdose Risk: Combining opioids and benzodiazepines can suppress breathing and impair cognition, increasing overdose risk. Emergency Visits: Concurrent use leads to higher rates of ER visits, hospital admissions, and overdose deaths. [place]  Study: Patients prescribed both had a 10x higher overdose death rate than those on opioids alone. Veterans Study: Benzodiazepine prescriptions increased overdose risk in a dose-dependent manner. Guidelines and Warnings CDC Recommendations: Clinicians should exercise caution when prescribing both drugs together. FDA Boxed Warnings: Both drug types now carry warnings about the dangers of concurrent use. Patient Responsibility: Patients should disclose all  medications and substances to their healthcare providers to manage risks.  Follow up with your primary doctor related to the music you hear that is not there for  a referral to audiologist:   Musical Ear Syndrome (MES) Definition: MES is a form of auditory hallucination where a person hears music or sounds that aren't actually present. Common Cause: Often linked to hearing loss, where the brain compensates for missing auditory input by generating music. Not a Psychiatric Disorder: Unlike hallucinations in psychosis, MES is not considered a mental illness. Experience Varies: Some find MES interesting or pleasant, while others may find it distressing. Can Coexist with Tinnitus: MES may occur alongside tinnitus but is a distinct condition. Treatment Options: Sound therapy Cognitive Behavioral Therapy (CBT) Medication (in some cases) Recommendation: Individuals experiencing MES should consult an audiologist to rule out other causes and explore treatment  Call 911, 988, mobile crisis, or present to the nearest emergency room should you experience any suicidal/homicidal ideation, auditory/visual/hallucinations, or detrimental worsening of your mental health.  Mobile Crisis Response Teams Listed by counties in vicinity of [facility] providers [facility] [phone] [facility] [phone] [facility] [phone] [facility] [phone] [place]                * [facility] [phone]                * [employer] [phone]  [facility] [phone] [facility] [employer]  [phone] * [employer] [phone]

## 2023-09-26 MED ORDER — SERTRALINE HCL 50 MG PO TABS: 50.0000 mg | ORAL_TABLET | Freq: Every day | ORAL | 1 refills | Status: AC

## 2023-09-26 NOTE — Addendum Note (Signed)
 Addended by: Remee Charley B on: 09/26/2023 02:29 PM   Modules accepted: Orders

## 2023-12-28 ENCOUNTER — Other Ambulatory Visit: Payer: Self-pay | Admitting: Internal Medicine

## 2023-12-28 ENCOUNTER — Ambulatory Visit
Admission: RE | Admit: 2023-12-28 | Discharge: 2023-12-28 | Disposition: A | Source: Ambulatory Visit | Attending: Internal Medicine | Admitting: Internal Medicine

## 2023-12-28 DIAGNOSIS — Z1231 Encounter for screening mammogram for malignant neoplasm of breast: Secondary | ICD-10-CM
